# Patient Record
Sex: Female | Born: 1960 | Race: White | Hispanic: No | Marital: Married | Smoking: Never smoker
Health system: Southern US, Community
[De-identification: ages and names within clinical notes are randomized; demographics above are authoritative.]

## PROBLEM LIST (undated history)

## (undated) DIAGNOSIS — E785 Hyperlipidemia, unspecified: Secondary | ICD-10-CM

## (undated) DIAGNOSIS — M40209 Unspecified kyphosis, site unspecified: Secondary | ICD-10-CM

## (undated) DIAGNOSIS — C801 Malignant (primary) neoplasm, unspecified: Secondary | ICD-10-CM

## (undated) DIAGNOSIS — K760 Fatty (change of) liver, not elsewhere classified: Secondary | ICD-10-CM

## (undated) DIAGNOSIS — I10 Essential (primary) hypertension: Secondary | ICD-10-CM

## (undated) DIAGNOSIS — E041 Nontoxic single thyroid nodule: Secondary | ICD-10-CM

## (undated) DIAGNOSIS — M419 Scoliosis, unspecified: Secondary | ICD-10-CM

## (undated) HISTORY — PX: APPENDECTOMY: SHX54

## (undated) HISTORY — DX: Essential (primary) hypertension: I10

## (undated) HISTORY — DX: Malignant (primary) neoplasm, unspecified: C80.1

## (undated) HISTORY — DX: Scoliosis, unspecified: M41.9

## (undated) HISTORY — DX: Unspecified kyphosis, site unspecified: M40.209

## (undated) HISTORY — PX: TONSILLECTOMY: SUR1361

## (undated) HISTORY — DX: Nontoxic single thyroid nodule: E04.1

## (undated) HISTORY — DX: Fatty (change of) liver, not elsewhere classified: K76.0

---
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
## 2013-01-19 HISTORY — PX: COLONOSCOPY: SHX174

## 2014-04-02 HISTORY — PX: BACK SURGERY: SHX140

## 2014-07-22 DIAGNOSIS — M4126 Other idiopathic scoliosis, lumbar region: Secondary | ICD-10-CM | POA: Insufficient documentation

## 2015-12-28 DIAGNOSIS — E041 Nontoxic single thyroid nodule: Secondary | ICD-10-CM | POA: Insufficient documentation

## 2016-11-15 DIAGNOSIS — I10 Essential (primary) hypertension: Secondary | ICD-10-CM | POA: Diagnosis not present

## 2016-11-15 DIAGNOSIS — R079 Chest pain, unspecified: Secondary | ICD-10-CM | POA: Diagnosis not present

## 2018-03-24 ENCOUNTER — Encounter: Payer: Self-pay | Admitting: Gastroenterology

## 2019-01-06 ENCOUNTER — Encounter: Payer: Self-pay | Admitting: Gastroenterology

## 2019-02-01 HISTORY — PX: COLONOSCOPY: SHX174

## 2019-02-04 ENCOUNTER — Encounter: Payer: Self-pay | Admitting: Gastroenterology

## 2019-02-04 ENCOUNTER — Ambulatory Visit: Payer: 59 | Admitting: Gastroenterology

## 2019-02-04 ENCOUNTER — Other Ambulatory Visit: Payer: Self-pay

## 2019-02-04 VITALS — BP 114/74 | HR 86 | Temp 98.7°F | Ht 66.0 in | Wt 173.1 lb

## 2019-02-04 DIAGNOSIS — Z1159 Encounter for screening for other viral diseases: Secondary | ICD-10-CM | POA: Diagnosis not present

## 2019-02-04 DIAGNOSIS — R1310 Dysphagia, unspecified: Secondary | ICD-10-CM

## 2019-02-04 DIAGNOSIS — Z8371 Family history of colonic polyps: Secondary | ICD-10-CM | POA: Diagnosis not present

## 2019-02-04 MED ORDER — CLENPIQ 10-3.5-12 MG-GM -GM/160ML PO SOLN: 1.0000 | Freq: Once | ORAL | 0 refills | Status: AC

## 2019-02-04 NOTE — Progress Notes (Signed)
Chief Complaint: For EGD/colon.  Referring Provider:  Cyndy Freeze, MD      ASSESSMENT AND PLAN;   #1.  Esophageal dysphagia. D/d includes eso stricture, Schatzki's ring, motility disorder, eosinophilic esophagitis, pill induced esophagitis, r/o esophageal Ca or extrinsic lesions.  #2. FH polyps (mom, dad- both around 28s, sis around 75)  Plan: - Proceed with EGD with possible dil/colon (pref Monday). Discussed risks & benefits. (Risks including rare perforation req laparotomy, bleeding after biopsies/polypectomy req blood transfusion, rare chance of missing neoplasms, risks of anesthesia/sedation). Benefits outweigh the risks. Patient agrees to proceed. All the questions were answered. Consent forms given for review. - Chew food well and eat slowly.    HPI:    Connie Cummings is a 58 y.o. 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  Patient's mother had to pat her back.  Ever since she has has been having some problems with intermittent dysphagia mostly to solids in mid chest.  Occasionally pills would get hung up but then will eventually go down.  No heartburn ever.  No odynophagia.  She was due for colonoscopy due to family history of colonic polyps.  She wanted to get EGD with dilatation if needed at the same time.   Currently, no nausea, vomiting, heartburn, regurgitation.  No significant diarrhea or constipation.  There is no melena or hematochezia. No unintentional weight loss.   Past GI procedures: Colonoscopy 12/2012-[facility], neg but fair prep Past Medical History:  Diagnosis Date  . Hyperlipidemia   . Hypertension     Past Surgical History:  Procedure Laterality Date  . APPENDECTOMY    . BACK SURGERY  2016   scoloissi and kyphois so rods and screws in bacl[place]   . CESAREAN SECTION    . COLONOSCOPY  01/19/2013   Small internal hemorrhoids. Otherwise normal colonoscopy to  terminal ileum   . TONSILLECTOMY      Family History  Problem Relation Age of Onset  . Colon polyps Mother   . Colon polyps Sister   . Heart disease Father   . Heart disease Paternal Grandmother   . Diabetes Paternal Uncle   . Diabetes Paternal Aunt   . Heart disease Paternal Aunt     Social History   Tobacco Use  . Smoking status: Never Smoker  . Smokeless tobacco: Never Used  Substance Use Topics  . Alcohol use: Not Currently  . Drug use: Never    Current Outpatient Medications  Medication Sig Dispense Refill  . Multiple Vitamins-Minerals (ZINC PO) Take 1-2 tablets by mouth daily.    Marland Kitchen b complex vitamins capsule Take 1 capsule by mouth daily.    . Multiple Vitamin (MULTI-VITAMIN) tablet Take 1 tablet by mouth daily.    Marland Kitchen telmisartan-hydrochlorothiazide (MICARDIS HCT) 80-25 MG tablet Take 1 tablet by mouth daily.     No current facility-administered medications for this visit.     Allergies  Allergen Reactions  . Oxycodone Shortness Of Breath  . Oxycontin [Oxycodone Hcl]   . Tramadol Rash    Review of Systems:  Constitutional: Denies fever, chills, diaphoresis, appetite change and fatigue.  HEENT: Denies photophobia, eye pain, redness, hearing loss, ear pain, congestion, sore throat, rhinorrhea, sneezing, mouth sores, neck pain, neck stiffness and tinnitus.   Respiratory: Denies SOB, DOE, cough, chest tightness,  and wheezing.   Cardiovascular: Denies chest pain, palpitations and leg swelling.  Genitourinary: Denies dysuria, urgency, frequency, hematuria, flank pain and difficulty urinating.  Musculoskeletal: Denies myalgias, back pain, joint swelling, arthralgias and gait problem.  Skin: No rash.  Neurological: Denies dizziness, seizures, syncope, weakness, light-headedness, numbness and headaches.  Hematological: Denies adenopathy. Easy bruising, personal or family bleeding history  Psychiatric/Behavioral: No anxiety or depression     Physical Exam:    BP  114/74   Pulse 86   Temp 98.7 F (37.1 C)   Ht 5\' 6"  (1.676 m)   Wt 173 lb 2 oz (78.5 kg)   BMI 27.94 kg/m  Filed Weights   02/04/19 0846  Weight: 173 lb 2 oz (78.5 kg)   Constitutional:  Well-developed, in no acute distress. Psychiatric: Normal mood and affect. Behavior is normal. HEENT: Pupils normal.  Conjunctivae are normal. No scleral icterus. Neck supple.  Cardiovascular: Normal rate, regular rhythm. No edema Pulmonary/chest: Effort normal and breath sounds normal. No wheezing, rales or rhonchi. Abdominal: Soft, nondistended. Nontender. Bowel sounds active throughout. There are no masses palpable. No hepatomegaly. Rectal:  defered Neurological: Alert and oriented to person place and time. Skin: Skin is warm and dry. No rashes noted.  Data Reviewed: I have personally reviewed following labs and imaging studies    Carmell Austria, MD 02/04/2019, 9:03 AM  Cc: Cyndy Freeze, MD

## 2019-02-04 NOTE — Patient Instructions (Signed)
You have been scheduled for an endoscopy and colonoscopy. Please follow the written instructions given to you at your visit today. Please pick up your prep supplies at the pharmacy within the next 1-3 days. If you use inhalers (even only as needed), please bring them with you on the day of your procedure. Your physician has requested that you go to www.startemmi.com and enter the access code given to you at your visit today. This web site gives a general overview about your procedure. However, you should still follow specific instructions given to you by our office regarding your preparation for the procedure.  Thank you,  Dr. Jackquline Denmark

## 2019-02-11 ENCOUNTER — Encounter: Payer: Self-pay | Admitting: Gastroenterology

## 2019-02-11 ENCOUNTER — Other Ambulatory Visit: Payer: Self-pay

## 2019-02-11 ENCOUNTER — Ambulatory Visit (AMBULATORY_SURGERY_CENTER): Payer: 59 | Admitting: Gastroenterology

## 2019-02-11 VITALS — BP 142/70 | HR 73 | Temp 98.2°F | Resp 11 | Ht 66.0 in | Wt 173.0 lb

## 2019-02-11 DIAGNOSIS — K2990 Gastroduodenitis, unspecified, without bleeding: Secondary | ICD-10-CM

## 2019-02-11 DIAGNOSIS — Z8371 Family history of colonic polyps: Secondary | ICD-10-CM

## 2019-02-11 DIAGNOSIS — B9681 Helicobacter pylori [H. pylori] as the cause of diseases classified elsewhere: Secondary | ICD-10-CM

## 2019-02-11 DIAGNOSIS — Z1211 Encounter for screening for malignant neoplasm of colon: Secondary | ICD-10-CM

## 2019-02-11 DIAGNOSIS — K2970 Gastritis, unspecified, without bleeding: Secondary | ICD-10-CM | POA: Diagnosis not present

## 2019-02-11 DIAGNOSIS — R1310 Dysphagia, unspecified: Secondary | ICD-10-CM | POA: Diagnosis not present

## 2019-02-11 DIAGNOSIS — K219 Gastro-esophageal reflux disease without esophagitis: Secondary | ICD-10-CM | POA: Diagnosis not present

## 2019-02-11 MED ORDER — OMEPRAZOLE 20 MG PO CPDR: 20.0000 mg | DELAYED_RELEASE_CAPSULE | Freq: Every day | ORAL | 2 refills | Status: DC

## 2019-02-11 MED ORDER — SODIUM CHLORIDE 0.9 % IV SOLN: 500.0000 mL | INTRAVENOUS | Status: DC

## 2019-02-11 NOTE — Op Note (Signed)
[place] Patient Name: Connie Cummings Procedure Date: 02/11/2019 2:58 PM MRN: [id number] Endoscopist: Jackquline Denmark , MD Age: 58 Referring MD:  Date of Birth: 1960/10/12 Gender: Female Account #: [id number] Procedure:                Colonoscopy Indications:              FH polyps (mom, dad- both around 26s, sis around 71) Medicines:                Monitored Anesthesia Care Procedure:                Pre-Anesthesia Assessment:                           - Prior to the procedure, a History and Physical                            was performed, and patient medications and                            allergies were reviewed. The patient's tolerance of                            previous anesthesia was also reviewed. The risks                            and benefits of the procedure and the sedation                            options and risks were discussed with the patient.                            All questions were answered, and informed consent                            was obtained. Prior Anticoagulants: The patient has                            taken no previous anticoagulant or antiplatelet                            agents. ASA Grade Assessment: II - A patient with                            mild systemic disease. After reviewing the risks                            and benefits, the patient was deemed in                            satisfactory condition to undergo the procedure.                           After obtaining informed consent, the colonoscope  was passed under direct vision. Throughout the                            procedure, the patient's blood pressure, pulse, and                            oxygen saturations were monitored continuously. The                            Colonoscope was introduced through the anus and                            advanced to the 2 cm into the ileum. The                            colonoscopy was  performed without difficulty. The                            patient tolerated the procedure well. The quality                            of the bowel preparation was good. The terminal                            ileum, ileocecal valve, appendiceal orifice, and                            rectum were photographed. Scope In: 3:26:31 PM Scope Out: 3:41:05 PM Scope Withdrawal Time: 0 hours 9 minutes 28 seconds  Total Procedure Duration: 0 hours 14 minutes 34 seconds  Findings:                 The colon (entire examined portion) appeared                            normal. The colon was somewhat redundant.                           Non-bleeding internal hemorrhoids were found during                            retroflexion. The hemorrhoids were small.                           The terminal ileum appeared normal.                           The exam was otherwise without abnormality on                            direct and retroflexion views. Complications:            No immediate complications. Estimated Blood Loss:     Estimated blood loss: none. Impression:               -Small internal hemorrhoids.                           -  Otherwise normal colonoscopy to TI. Recommendation:           - Patient has a contact number available for                            emergencies. The signs and symptoms of potential                            delayed complications were discussed with the                            patient. Return to normal activities tomorrow.                            Written discharge instructions were provided to the                            patient.                           - Resume previous diet.                           - Continue present medications.                           - Repeat colonoscopy in 5 years for screening                            purposes d/t strong family history of colonic                            polyps. Earlier, if with any new problems or if                             there is any change in family history.                           - Return to GI clinic PRN.                           - D/W Melene Plan, MD 02/11/2019 3:55:02 PM This report has been signed electronically.

## 2019-02-11 NOTE — Progress Notes (Signed)
A/ox3, pleased with MAC, report to RN 

## 2019-02-11 NOTE — Op Note (Signed)
[place] Patient Name: Connie Cummings Procedure Date: 02/11/2019 3:00 PM MRN: [id number] Endoscopist: Jackquline Denmark , MD Age: 58 Referring MD:  Date of Birth: 06-Jun-1960 Gender: Female Account #: [id number] Procedure:                Upper GI endoscopy Indications:              Dysphagia Medicines:                Monitored Anesthesia Care Procedure:                Pre-Anesthesia Assessment:                           - Prior to the procedure, a History and Physical                            was performed, and patient medications and                            allergies were reviewed. The patient's tolerance of                            previous anesthesia was also reviewed. The risks                            and benefits of the procedure and the sedation                            options and risks were discussed with the patient.                            All questions were answered, and informed consent                            was obtained. Prior Anticoagulants: The patient has                            taken no previous anticoagulant or antiplatelet                            agents. ASA Grade Assessment: II - A patient with                            mild systemic disease. After reviewing the risks                            and benefits, the patient was deemed in                            satisfactory condition to undergo the procedure.                           After obtaining informed consent, the endoscope was  passed under direct vision. Throughout the                            procedure, the patient's blood pressure, pulse, and                            oxygen saturations were monitored continuously. The                            Endoscope was introduced through the mouth, and                            advanced to the second part of duodenum. The upper                            GI endoscopy was accomplished without  difficulty.                            The patient tolerated the procedure well. Scope In: Scope Out: Findings:                 The lower third of the esophagus was mildly                            tortuous. Z-line was normal at 35 cm. Examined by                            NBI as well. Biopsies were obtained from the                            proximal and distal esophagus with cold forceps to                            r/o eosinophilic esophagitis. The scope was                            withdrawn. Dilation was performed with a Maloney                            dilator with mild resistance at 50 Fr.                           Localized minimal inflammation characterized by                            erythema was found in the gastric antrum. Biopsies                            were taken with a cold forceps for histology.                           The examined duodenum was normal. Complications:            No immediate complications. Estimated Blood Loss:  Estimated blood loss: none. Impression:               -Mildly tortuous esophagus s/p empiric esophageal                            dilatation.                           -Minimal gastritis. Recommendation:           - Patient has a contact number available for                            emergencies. The signs and symptoms of potential                            delayed complications were discussed with the                            patient. Return to normal activities tomorrow.                            Written discharge instructions were provided to the                            patient.                           - Post dilatation diet.                           - Omeprazole 20 mg p.o. once a day, #30, 2 refills                            until follow-up visit.                           - Await pathology results.                           - Return to GI clinic in 12 weeks. If with any                            further  dysphagia, would perform further evaluation. Jackquline Denmark, MD 02/11/2019 3:48:46 PM This report has been signed electronically.

## 2019-02-11 NOTE — Patient Instructions (Signed)
HANDOUTS PROVIDED ON: POST DILATION DIET, GASTRITIS, & HEMORRHOIDS   THE BIOPSIES TAKEN TODAY HAVE BEEN SENT FOR PATHOLOGY.  THE RESULTS CAN TAKE 2-3 WEEKS TO RECEIVE.    YOU MAY RESUME YOUR PREVIOUS MEDICATION SCHEDULE.  START TAKING OMEPRAZOLE 20 mg BY MOUTH ONCE DAILY.    RETURN TO GI CLINIC IN 12 WEEKS.  REPEAT COLONOSCOPY IN 5 YEARS UNLESS A NEED ARISES BEFOREHAND.  [place] YOU FOR ALLOWING [place] TO CARE FOR YOU TODAY!!!  YOU HAD AN ENDOSCOPIC PROCEDURE TODAY AT [facility]:   Refer to the procedure report that was given to you for any specific questions about what was found during the examination.  If the procedure report does not answer your questions, please call your gastroenterologist to clarify.  If you requested that your care partner not be given the details of your procedure findings, then the procedure report has been included in a sealed envelope for you to review at your convenience later.  YOU SHOULD EXPECT: Some feelings of bloating in the abdomen. Passage of more gas than usual.  Walking can help get rid of the air that was put into your GI tract during the procedure and reduce the bloating. If you had a lower endoscopy (such as a colonoscopy or flexible sigmoidoscopy) you may notice spotting of blood in your stool or on the toilet paper. If you underwent a bowel prep for your procedure, you may not have a normal bowel movement for a few days.  Please Note:  You might notice some irritation and congestion in your nose or some drainage.  This is from the oxygen used during your procedure.  There is no need for concern and it should clear up in a day or so.  SYMPTOMS TO REPORT IMMEDIATELY:   Following lower endoscopy (colonoscopy or flexible sigmoidoscopy):  Excessive amounts of blood in the stool  Significant tenderness or worsening of abdominal pains  Swelling of the abdomen that is new, acute  Fever of 100F or higher   Following upper endoscopy  (EGD)  Vomiting of blood or coffee ground material  New chest pain or pain under the shoulder blades  Painful or persistently difficult swallowing  New shortness of breath  Fever of 100F or higher  Black, tarry-looking stools  For urgent or emergent issues, a gastroenterologist can be reached at any hour by calling [phone].   DIET:  We do recommend a small meal at first, but then you may proceed to your regular diet.  Drink plenty of fluids but you should avoid alcoholic beverages for 24 hours.  ACTIVITY:  You should plan to take it easy for the rest of today and you should NOT DRIVE or use heavy machinery until tomorrow (because of the sedation medicines used during the test).    FOLLOW UP: Our staff will call the number listed on your records 48-72 hours following your procedure to check on you and address any questions or concerns that you may have regarding the information given to you following your procedure. If we do not reach you, we will leave a message.  We will attempt to reach you two times.  During this call, we will ask if you have developed any symptoms of COVID 19. If you develop any symptoms (ie: fever, flu-like symptoms, shortness of breath, cough etc.) before then, please call [phone].  If you test positive for Covid 19 in the 2 weeks post procedure, please call and report this information to [place].  If any biopsies were taken you will be contacted by phone or by letter within the next 1-3 weeks.  Please call us at [phone] if you have not heard about the biopsies in 3 weeks.    SIGNATURES/CONFIDENTIALITY: You and/or your care partner have signed paperwork which will be entered into your electronic medical record.  These signatures attest to the fact that that the information above on your After Visit Summary has been reviewed and is understood.  Full responsibility of the confidentiality of this discharge information lies with you and/or your  care-partner.

## 2019-02-11 NOTE — Progress Notes (Signed)
Called to room to assist during endoscopic procedure.  Patient ID and intended procedure confirmed with present staff. Received instructions for my participation in the procedure from the performing physician.  

## 2019-02-11 NOTE — Progress Notes (Signed)
Temp JB  vs CW 

## 2019-02-13 ENCOUNTER — Telehealth: Payer: Self-pay

## 2019-02-13 NOTE — Telephone Encounter (Signed)
  Follow up Call-  Call back number 02/11/2019  Post procedure Call Back phone  # [phone]  Permission to leave phone message Yes  Some recent data might be hidden     Patient questions:  Do you have a fever, pain , or abdominal swelling? No. Pain Score  0 *  Have you tolerated food without any problems? Yes.    Have you been able to return to your normal activities? Yes.    Do you have any questions about your discharge instructions: Diet   No. Medications  No. Follow up visit  No.  Do you have questions or concerns about your Care? No.  Actions: * If pain score is 4 or above: No action needed, pain <4.  1. Have you developed a fever since your procedure? no  2.   Have you had an respiratory symptoms (SOB or cough) since your procedure? no  3.   Have you tested positive for COVID 19 since your procedure no  4.   Have you had any family members/close contacts diagnosed with the COVID 19 since your procedure?  no   If yes to any of these questions please route to Joylene John, RN and Alphonsa Gin, Therapist, sports.

## 2019-02-19 ENCOUNTER — Encounter: Payer: Self-pay | Admitting: Gastroenterology

## 2019-02-19 ENCOUNTER — Other Ambulatory Visit: Payer: Self-pay

## 2019-02-19 DIAGNOSIS — A048 Other specified bacterial intestinal infections: Secondary | ICD-10-CM

## 2019-02-19 MED ORDER — METRONIDAZOLE 500 MG PO TABS: 500.0000 mg | ORAL_TABLET | Freq: Two times a day (BID) | ORAL | 0 refills | Status: AC

## 2019-02-19 MED ORDER — AMOXICILLIN 500 MG PO CAPS: 1000.0000 mg | ORAL_CAPSULE | Freq: Two times a day (BID) | ORAL | 0 refills | Status: AC

## 2019-02-19 MED ORDER — OMEPRAZOLE 20 MG PO CPDR: 20.0000 mg | DELAYED_RELEASE_CAPSULE | Freq: Two times a day (BID) | ORAL | 0 refills | Status: DC

## 2019-02-19 MED ORDER — CLARITHROMYCIN 500 MG PO TABS: 500.0000 mg | ORAL_TABLET | Freq: Two times a day (BID) | ORAL | 0 refills | Status: AC

## 2019-02-23 ENCOUNTER — Telehealth: Payer: Self-pay | Admitting: Gastroenterology

## 2019-02-23 NOTE — Telephone Encounter (Signed)
Nausea/diarrhea-since colonoscopy but worse since starting antibiotics-Biaxin is causing nausea=other antibiotics causing horrible diarrhea Eating/not eating=still having diarrhea-requesting alternative antibiotics or medications for H.Pylori-metallic taste mouth-had to call out of work today due to the diarrhea and nausea-eating when taking the medication and not drinking alcohol with medication- Please advise

## 2019-02-24 NOTE — Telephone Encounter (Signed)
Hold off on medications for now Let us see how she does in the next few days. Please make appointment for virtual visit in the next 2 weeks. May have to give her something else for H. Pylori. RG

## 2019-02-25 NOTE — Telephone Encounter (Signed)
Called and spoke with patient-patient reports she has still been taking the antibiotics and she is feeling "much better than I was"; patient report she will continue to take the antibiotics and follow up with Dr. Lyndel Safe on a VIRTUAL visit on 03/12/2019 at 11:40; Patient advised to call back to the office at [phone] should questions/concerns arise; Patient verbalized understanding of information/instructions;

## 2019-03-03 ENCOUNTER — Other Ambulatory Visit: Payer: Self-pay | Admitting: Endocrinology

## 2019-03-03 DIAGNOSIS — E041 Nontoxic single thyroid nodule: Secondary | ICD-10-CM

## 2019-03-12 ENCOUNTER — Telehealth: Payer: 59 | Admitting: Gastroenterology

## 2019-04-02 ENCOUNTER — Other Ambulatory Visit: Payer: 59

## 2019-04-02 DIAGNOSIS — A048 Other specified bacterial intestinal infections: Secondary | ICD-10-CM

## 2019-04-07 ENCOUNTER — Other Ambulatory Visit: Payer: 59

## 2019-04-08 ENCOUNTER — Other Ambulatory Visit: Payer: 59

## 2019-04-08 LAB — H. PYLORI ANTIGEN, STOOL: H pylori Ag, Stl: NEGATIVE

## 2019-04-29 ENCOUNTER — Ambulatory Visit
Admission: RE | Admit: 2019-04-29 | Discharge: 2019-04-29 | Disposition: A | Discharge status: Home / Self Care | Payer: 59 | Source: Ambulatory Visit | Attending: Endocrinology | Admitting: Endocrinology

## 2019-04-29 DIAGNOSIS — E041 Nontoxic single thyroid nodule: Secondary | ICD-10-CM

## 2019-04-29 DIAGNOSIS — E042 Nontoxic multinodular goiter: Secondary | ICD-10-CM | POA: Diagnosis not present

## 2019-05-01 DIAGNOSIS — L259 Unspecified contact dermatitis, unspecified cause: Secondary | ICD-10-CM | POA: Diagnosis not present

## 2019-05-01 DIAGNOSIS — Z6829 Body mass index (BMI) 29.0-29.9, adult: Secondary | ICD-10-CM | POA: Diagnosis not present

## 2019-05-06 ENCOUNTER — Other Ambulatory Visit: Payer: Self-pay

## 2019-05-06 ENCOUNTER — Telehealth (INDEPENDENT_AMBULATORY_CARE_PROVIDER_SITE_OTHER): Payer: 59 | Admitting: Gastroenterology

## 2019-05-06 VITALS — Ht 66.0 in | Wt 170.0 lb

## 2019-05-06 DIAGNOSIS — E041 Nontoxic single thyroid nodule: Secondary | ICD-10-CM | POA: Diagnosis not present

## 2019-05-06 DIAGNOSIS — I10 Essential (primary) hypertension: Secondary | ICD-10-CM | POA: Diagnosis not present

## 2019-05-06 DIAGNOSIS — R1310 Dysphagia, unspecified: Secondary | ICD-10-CM

## 2019-05-06 DIAGNOSIS — A048 Other specified bacterial intestinal infections: Secondary | ICD-10-CM

## 2019-05-06 DIAGNOSIS — E0590 Thyrotoxicosis, unspecified without thyrotoxic crisis or storm: Secondary | ICD-10-CM | POA: Diagnosis not present

## 2019-05-06 NOTE — Progress Notes (Signed)
Chief Complaint: For FU  Referring Provider:  Mateo Flow, MD      ASSESSMENT AND PLAN;   #1. Eso dysphagia s/p EGD with dil 02/2019 -complete resolution of dysphagia.   #2. FH polyps (mom, dad- both around 9s, sis around 24). Neg colon 02/2019.  Recommended to rpt in 5 yrs  #3. 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  Treated with triple drug therapy. Confirmed neg.  Plan: -Continue omeprazole 20 mg p.o. once a day.  Can try it every other day. -Call if with any problems. -Follow-up as needed.    HPI:    Connie Cummings is a 59 y.o. 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  No further dysphagia.  No heartburn.  Feels much better.  Denies having any diarrhea or constipation.  Had stool antigen which was confirmed negative for H. pylori per patient.  Pleased with the progress  Colonoscopy 02/2019: Small internal hemorrhoids, otherwise Nl to TI EGD 02/2019-mildly tortuous esophagus , s/p dil 50 Fr. Neg Bx for EoE. HP gastritis.  Treated with clarithromycin, amox/Flagyl/omeprazole x 14 days.  Stool test was confirmed neg thereafter. Past Medical History:  Diagnosis Date  . Cancer ([place])    melanoma  . Hypertension   . Kyphosis   . Scoliosis     Past Surgical History:  Procedure Laterality Date  . APPENDECTOMY    . BACK SURGERY  2016   scoliosis and kyphosis so rods and screws in back  . CESAREAN SECTION    . COLONOSCOPY  01/19/2013   Small internal hemorrhoids. Otherwise normal colonoscopy to terminal ileum   . TONSILLECTOMY      Family History  Problem Relation Age of Onset  . Colon polyps Mother   . Colon polyps Sister   . Heart disease Father   . Heart disease Paternal Grandmother   . Diabetes Paternal Uncle   . Diabetes Paternal Aunt   . Heart disease Paternal Aunt     Social History   Tobacco Use  . Smoking status: Never Smoker  . Smokeless tobacco: Never Used  Substance Use Topics  . Alcohol use: Not Currently  . Drug  use: Never    Current Outpatient Medications  Medication Sig Dispense Refill  . 5-Hydroxytryptophan (5-HTP PO) Take 2 tablets by mouth daily.     . AMBULATORY NON FORMULARY MEDICATION 2 tablets daily. Estropause     . AMBULATORY NON FORMULARY MEDICATION 1 tablet daily. Liver Refresh    . b complex vitamins capsule Take 1 capsule by mouth daily.    Marland Kitchen CALCIUM PO Take 1 tablet by mouth daily.    Marland Kitchen estradiol (VIVELLE-DOT) 0.05 MG/24HR patch Place 1 patch onto the skin 2 (two) times a week.     Marland Kitchen GLUCOSAMINE-CHONDROIT-MSM-C-MN PO Take 1 tablet by mouth daily.    Marland Kitchen L-LYSINE PO Take 1 tablet by mouth daily.    Marland Kitchen MAGNESIUM PO Take 1 tablet by mouth daily.    . Misc Natural Products (ADRENAL PO) Take 1 tablet by mouth daily.    . Multiple Vitamin (MULTI-VITAMIN) tablet Take 1 tablet by mouth daily.    . Multiple Vitamins-Minerals (ZINC PO) Take 1-2 tablets by mouth daily.    . Nutritional Supplements (DHEA PO) Take 1 tablet by mouth daily.    Marland Kitchen omeprazole (PRILOSEC) 20 MG capsule Take 1 capsule (20 mg total) by mouth daily. 30 capsule 2  . Probiotic Product (PROBIOTIC PO) Take 1 tablet by mouth daily.  Brand Name Mega Sporebiotic    . PROGESTERONE MICRONIZED PO Take 300 mg by mouth daily.    Marland Kitchen telmisartan-hydrochlorothiazide (MICARDIS HCT) 80-25 MG tablet Take 1 tablet by mouth daily.    . Vitamin D, Ergocalciferol, (DRISDOL) 1.25 MG (50000 UT) CAPS capsule Take 50,000 Units by mouth every 7 (seven) days.     . Vitamin D-Vitamin K (VITAMIN K2-VITAMIN D3 PO) Take 1 tablet by mouth daily.     No current facility-administered medications for this visit.    Allergies  Allergen Reactions  . Oxycodone Shortness Of Breath  . Oxycontin [Oxycodone Hcl]   . Tramadol Rash    Review of Systems:  neg     Physical Exam:    Ht 5\' 6"  (1.676 m)   Wt 170 lb (77.1 kg)   BMI 27.44 kg/m  Filed Weights   05/06/19 0803  Weight: 170 lb (77.1 kg)  Not examined since it was a televisit  I connected  with  Connie Cummings on 05/06/19 by a telephone telemedicine application and verified that I am speaking with the correct person using two identifiers.   I discussed the limitations of evaluation and management by telemedicine. The patient expressed understanding and agreed to proceed.      Carmell Austria, MD 05/06/2019, 8:50 AM  Cc: Mateo Flow, MD

## 2019-05-06 NOTE — Patient Instructions (Signed)
If you are age 58 or older, your body mass index should be between 23-30. Your Body mass index is 27.44 kg/m. If this is out of the aforementioned range listed, please consider follow up with your Primary Care Provider.  If you are age 53 or younger, your body mass index should be between 19-25. Your Body mass index is 27.44 kg/m. If this is out of the aformentioned range listed, please consider follow up with your Primary Care Provider.   Continue Omeprazole  Follow up as needed.   Thank you,  Dr. Jackquline Denmark

## 2019-06-03 DIAGNOSIS — Z85820 Personal history of malignant melanoma of skin: Secondary | ICD-10-CM | POA: Diagnosis not present

## 2019-06-03 DIAGNOSIS — L814 Other melanin hyperpigmentation: Secondary | ICD-10-CM | POA: Diagnosis not present

## 2019-06-03 DIAGNOSIS — Z85828 Personal history of other malignant neoplasm of skin: Secondary | ICD-10-CM | POA: Diagnosis not present

## 2019-06-03 DIAGNOSIS — D225 Melanocytic nevi of trunk: Secondary | ICD-10-CM | POA: Diagnosis not present

## 2019-06-03 DIAGNOSIS — L821 Other seborrheic keratosis: Secondary | ICD-10-CM | POA: Diagnosis not present

## 2019-06-03 DIAGNOSIS — D1801 Hemangioma of skin and subcutaneous tissue: Secondary | ICD-10-CM | POA: Diagnosis not present

## 2019-06-03 DIAGNOSIS — L570 Actinic keratosis: Secondary | ICD-10-CM | POA: Diagnosis not present

## 2019-07-14 DIAGNOSIS — R5383 Other fatigue: Secondary | ICD-10-CM | POA: Diagnosis not present

## 2019-07-14 DIAGNOSIS — E639 Nutritional deficiency, unspecified: Secondary | ICD-10-CM | POA: Diagnosis not present

## 2019-07-14 DIAGNOSIS — E559 Vitamin D deficiency, unspecified: Secondary | ICD-10-CM | POA: Diagnosis not present

## 2019-07-14 DIAGNOSIS — E041 Nontoxic single thyroid nodule: Secondary | ICD-10-CM | POA: Diagnosis not present

## 2019-07-14 DIAGNOSIS — N951 Menopausal and female climacteric states: Secondary | ICD-10-CM | POA: Diagnosis not present

## 2019-07-31 DIAGNOSIS — E559 Vitamin D deficiency, unspecified: Secondary | ICD-10-CM | POA: Diagnosis not present

## 2019-07-31 DIAGNOSIS — L239 Allergic contact dermatitis, unspecified cause: Secondary | ICD-10-CM | POA: Diagnosis not present

## 2019-07-31 DIAGNOSIS — E639 Nutritional deficiency, unspecified: Secondary | ICD-10-CM | POA: Diagnosis not present

## 2019-07-31 DIAGNOSIS — E041 Nontoxic single thyroid nodule: Secondary | ICD-10-CM | POA: Diagnosis not present

## 2019-07-31 DIAGNOSIS — R238 Other skin changes: Secondary | ICD-10-CM | POA: Diagnosis not present

## 2019-07-31 DIAGNOSIS — N951 Menopausal and female climacteric states: Secondary | ICD-10-CM | POA: Diagnosis not present

## 2019-07-31 DIAGNOSIS — R5383 Other fatigue: Secondary | ICD-10-CM | POA: Diagnosis not present

## 2019-07-31 DIAGNOSIS — R208 Other disturbances of skin sensation: Secondary | ICD-10-CM | POA: Diagnosis not present

## 2019-07-31 DIAGNOSIS — R69 Illness, unspecified: Secondary | ICD-10-CM | POA: Diagnosis not present

## 2019-11-06 DIAGNOSIS — Z20822 Contact with and (suspected) exposure to covid-19: Secondary | ICD-10-CM | POA: Diagnosis not present

## 2019-12-24 DIAGNOSIS — Z20822 Contact with and (suspected) exposure to covid-19: Secondary | ICD-10-CM | POA: Diagnosis not present

## 2020-01-15 DIAGNOSIS — L239 Allergic contact dermatitis, unspecified cause: Secondary | ICD-10-CM | POA: Diagnosis not present

## 2020-01-15 DIAGNOSIS — E639 Nutritional deficiency, unspecified: Secondary | ICD-10-CM | POA: Diagnosis not present

## 2020-01-15 DIAGNOSIS — R5383 Other fatigue: Secondary | ICD-10-CM | POA: Diagnosis not present

## 2020-01-15 DIAGNOSIS — E041 Nontoxic single thyroid nodule: Secondary | ICD-10-CM | POA: Diagnosis not present

## 2020-01-15 DIAGNOSIS — E538 Deficiency of other specified B group vitamins: Secondary | ICD-10-CM | POA: Diagnosis not present

## 2020-01-15 DIAGNOSIS — E559 Vitamin D deficiency, unspecified: Secondary | ICD-10-CM | POA: Diagnosis not present

## 2020-01-15 DIAGNOSIS — R208 Other disturbances of skin sensation: Secondary | ICD-10-CM | POA: Diagnosis not present

## 2020-01-15 DIAGNOSIS — E039 Hypothyroidism, unspecified: Secondary | ICD-10-CM | POA: Diagnosis not present

## 2020-01-15 DIAGNOSIS — R69 Illness, unspecified: Secondary | ICD-10-CM | POA: Diagnosis not present

## 2020-01-15 DIAGNOSIS — N951 Menopausal and female climacteric states: Secondary | ICD-10-CM | POA: Diagnosis not present

## 2020-01-15 DIAGNOSIS — R238 Other skin changes: Secondary | ICD-10-CM | POA: Diagnosis not present

## 2020-01-25 DIAGNOSIS — H00022 Hordeolum internum right lower eyelid: Secondary | ICD-10-CM | POA: Diagnosis not present

## 2020-01-25 DIAGNOSIS — Z6831 Body mass index (BMI) 31.0-31.9, adult: Secondary | ICD-10-CM | POA: Diagnosis not present

## 2020-01-25 DIAGNOSIS — I10 Essential (primary) hypertension: Secondary | ICD-10-CM | POA: Diagnosis not present

## 2020-01-29 DIAGNOSIS — R238 Other skin changes: Secondary | ICD-10-CM | POA: Diagnosis not present

## 2020-01-29 DIAGNOSIS — R5383 Other fatigue: Secondary | ICD-10-CM | POA: Diagnosis not present

## 2020-01-29 DIAGNOSIS — L239 Allergic contact dermatitis, unspecified cause: Secondary | ICD-10-CM | POA: Diagnosis not present

## 2020-01-29 DIAGNOSIS — E538 Deficiency of other specified B group vitamins: Secondary | ICD-10-CM | POA: Diagnosis not present

## 2020-01-29 DIAGNOSIS — N951 Menopausal and female climacteric states: Secondary | ICD-10-CM | POA: Diagnosis not present

## 2020-01-29 DIAGNOSIS — E559 Vitamin D deficiency, unspecified: Secondary | ICD-10-CM | POA: Diagnosis not present

## 2020-01-29 DIAGNOSIS — R69 Illness, unspecified: Secondary | ICD-10-CM | POA: Diagnosis not present

## 2020-01-29 DIAGNOSIS — R208 Other disturbances of skin sensation: Secondary | ICD-10-CM | POA: Diagnosis not present

## 2020-01-29 DIAGNOSIS — E041 Nontoxic single thyroid nodule: Secondary | ICD-10-CM | POA: Diagnosis not present

## 2020-01-29 DIAGNOSIS — M25541 Pain in joints of right hand: Secondary | ICD-10-CM | POA: Diagnosis not present

## 2020-02-12 DIAGNOSIS — Z0000 Encounter for general adult medical examination without abnormal findings: Secondary | ICD-10-CM | POA: Diagnosis not present

## 2020-02-12 DIAGNOSIS — N3946 Mixed incontinence: Secondary | ICD-10-CM | POA: Diagnosis not present

## 2020-02-12 DIAGNOSIS — Z780 Asymptomatic menopausal state: Secondary | ICD-10-CM | POA: Diagnosis not present

## 2020-02-12 DIAGNOSIS — Z1331 Encounter for screening for depression: Secondary | ICD-10-CM | POA: Diagnosis not present

## 2020-02-12 DIAGNOSIS — Z124 Encounter for screening for malignant neoplasm of cervix: Secondary | ICD-10-CM | POA: Diagnosis not present

## 2020-02-12 DIAGNOSIS — M810 Age-related osteoporosis without current pathological fracture: Secondary | ICD-10-CM | POA: Diagnosis not present

## 2020-03-09 DIAGNOSIS — Z1231 Encounter for screening mammogram for malignant neoplasm of breast: Secondary | ICD-10-CM | POA: Diagnosis not present

## 2020-03-17 NOTE — Progress Notes (Signed)
Office Visit Note  Patient: Connie Cummings             Date of Birth: March 17, 1961           MRN: [id number]             PCP: Mateo Flow, MD Referring: Mateo Flow, MD Visit Date: 03/18/2020 Occupation: Home health nurse  Subjective:   History of Present Illness: Connie Cummings is a 59 y.o. female here for evaluation of positive ANA and joint pain of multiple sites most problems in bilateral hands.  She states the problem is ongoing for several years with typically episodic pain that is affecting many joints including her hands shoulders hips and knees.  She is really extensive bony changes of bilateral hands which are often painful.  In addition to joint pain she has noticed fatigue, petechial rash the lower extremities that she most often notices in the fall and spring.  She states prior biopsy was thought to represent vasculitis by one dermatologist but had difference of opinion with more recent dermatologist. 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 She denies any known specific family history of autoimmune disease, she recalls her father had some type of hand problems early onset but passed away at 58 y/o.  Labs reviewed 01/2020 ANA positive dsDNA 106 RF negative CCP negative ESR 16  Activities of Daily Living:  Patient reports morning stiffness for 0-24 hours.   Patient Reports nocturnal pain.  Difficulty dressing/grooming: Reports Difficulty climbing stairs: Denies Difficulty getting out of chair: Denies Difficulty using hands for taps, buttons, cutlery, and/or writing: Reports  Review of Systems  Constitutional: Positive for fatigue.  HENT: Negative for mouth sores, mouth dryness and nose dryness.   Eyes: Positive for pain, itching and visual disturbance. Negative for dryness.  Respiratory: Negative for cough, hemoptysis, shortness of breath and  difficulty breathing.   Cardiovascular: Positive for swelling in legs/feet. Negative for chest pain and palpitations.  Gastrointestinal: Negative for abdominal pain, blood in stool, constipation and diarrhea.  Endocrine: Negative for increased urination.  Genitourinary: Negative for painful urination.  Musculoskeletal: Positive for arthralgias, joint pain, joint swelling, morning stiffness and muscle tenderness. Negative for myalgias, muscle weakness and myalgias.  Skin: Negative for color change, rash and redness.  Allergic/Immunologic: Negative for susceptible to infections.  Neurological: Positive for dizziness and weakness. Negative for numbness, headaches and memory loss.  Hematological: Negative for swollen glands.  Psychiatric/Behavioral: Negative for confusion and sleep disturbance.    PMFS History:  Patient Active Problem List   Diagnosis Date Noted  . Bilateral hand pain 03/18/2020  . Positive ANA (antinuclear antibody) 03/18/2020  . Petechial rash 03/18/2020  . Essential hypertension 03/18/2020  . Subclinical hyperthyroidism 03/18/2020  . Thyroid nodule 12/28/2015  . Other idiopathic scoliosis, lumbar region 07/22/2014    Past Medical History:  Diagnosis Date  . Cancer ([place])    melanoma  . Hypertension   . Kyphosis   . Scoliosis   . Thyroid nodule    Per patient    Family History  Problem Relation Age of Onset  . Thyroid disease Mother   . Colon polyps Sister   . Thyroid disease Sister   . Heart disease Father   . Heart disease Paternal Grandmother   . Diabetes Paternal Uncle   . Diabetes Paternal Aunt   . Heart disease Paternal Aunt   .  Stroke Daughter   . Celiac disease Daughter   . Healthy Daughter   . Thyroid disease Daughter   . Healthy Son    Past Surgical History:  Procedure Laterality Date  . APPENDECTOMY    . BACK SURGERY  2016   scoliosis and kyphosis so rods and screws in back  . CESAREAN SECTION    . COLONOSCOPY  01/19/2013   Small  internal hemorrhoids. Otherwise normal colonoscopy to terminal ileum   . TONSILLECTOMY     Social History   Social History Narrative  . Not on file   Immunization History  Administered Date(s) Administered  . PFIZER SARS-COV-2 Vaccination 06/17/2019, 07/08/2019     Objective: Vital Signs: BP 139/84 (BP Location: Right Arm, Patient Position: Sitting, Cuff Size: Normal)   Pulse 71   Ht 5' 4.75" (1.645 m)   Wt 188 lb (85.3 kg)   BMI 31.53 kg/m    Physical Exam HENT:     Right Ear: External ear normal.     Left Ear: External ear normal.     Mouth/Throat:     Mouth: Mucous membranes are moist.     Pharynx: Oropharynx is clear.  Eyes:     Comments: Mild conjunctival injection bilaterally  Cardiovascular:     Rate and Rhythm: Normal rate and regular rhythm.  Pulmonary:     Effort: Pulmonary effort is normal.     Breath sounds: Normal breath sounds.  Skin:    General: Skin is warm and dry.     Comments: Scattered petechiae over ankles and dorsal foot most on the medial half no significant associated edema or erythema there are some superficial venous varicosities  Neurological:     Mental Status: She is alert.      Musculoskeletal Exam:  Neck full range of motion no tenderness Shoulders full range of motion bilaterally Elbows no swelling tenderness left extension range of motion slightly reduced compared to right Wrist normal range of motion no swelling or tenderness Numerous bony nodules and joint deviations and rotations present at the PIP and DIP joints of both hands right hand worse than left and second and third digits worse no synovitis No paraspinal tenderness to palpation over upper and lower back well-healed surgical scar over most of the back Normal hip internal and external rotation mild pain to right groin, right lateral hip tenderness to pressure Knees full range of motion no swelling, ankles, MTPs full range of motion no tenderness or  swelling    Investigation: No additional findings.  Imaging: XR Hand 2 View Left  Result Date: 03/18/2020 X-ray left hand 2 views Radiocarpal carpal joint space appear normal.  MCP joints appear well-preserved.  OA of the PIP joints especially the ulnar half of second and third digits.  DIP joint space also seen worst at third digit but less than PIPs.  No significant particular osteopenia seen.  No soft tissue swelling seen. Impression Osteoarthritis changes most severe in the second and third PIP joints no inflammatory abnormality seen  XR Hand 2 View Right  Result Date: 03/18/2020 X-ray right hand 2 views Radiocarpal carpal joint spaces appear preserved.  Some cystic changes in carpal bones.  MCP joints are well preserved.  Severe osteoarthritis changes seen in second and fifth PIP joints with osteophytes and irregular joint space loss.  DIP joint OA most severe at third digit joint appears fused.  No bone erosions seen no significant particular osteopenia.  No soft tissue swelling seen. Impression Advanced OA in  the PIP and DIP joints with large osteophytes and extensive remodeling no inflammatory abnormalities noted   Recent Labs: No results found for: WBC, HGB, PLT, NA, K, CL, CO2, GLUCOSE, BUN, CREATININE, BILITOT, ALKPHOS, AST, ALT, PROT, ALBUMIN, CALCIUM, GFRAA, QFTBGOLD, QFTBGOLDPLUS  Speciality Comments: No specialty comments available.  Procedures:  No procedures performed Allergies: Oxycodone, Oxycontin [oxycodone hcl], and Tramadol   Assessment / Plan:     Visit Diagnoses: Positive ANA (antinuclear antibody) Petechial rash  Her previous urology testing is a significantly positive double-stranded DNA antibody but other findings would not classify systemic lupus and today symptoms are nearly absent.  She has had petechial rash to the lower extremities sounds like questionable biopsy histology seasonal correlation of symptoms unusual for this.  Recommend send for AVISE  testing for complete autoimmune antibody work-up including confirmatory testing of dsDNA by crithidia.  No specific treatment recommendation in the absence of many symptoms but also recommend she could return to repeat evaluation when she does next experience a flareup of her problems.  Bilateral hand pain - Plan: XR Hand 2 View Right, XR Hand 2 View Left  Bilateral hand pain with extensive bony changes seen on exam x-rays were obtained consistent with advanced osteoarthritis.  No central erosions but has third DIP fusion and extensive abnormality involved joints.  No osteopenia or erosions to indicate active inflammatory disease.  Orders: Orders Placed This Encounter  Procedures  . XR Hand 2 View Right  . XR Hand 2 View Left   No orders of the defined types were placed in this encounter.    Follow-Up Instructions: Return if symptoms worsen or fail to improve.   Collier Salina, MD  Note - This record has been created using [employer].  Chart creation errors have been sought, but may not always  have been located. Such creation errors do not reflect on  the standard of medical care.

## 2020-03-18 ENCOUNTER — Ambulatory Visit (INDEPENDENT_AMBULATORY_CARE_PROVIDER_SITE_OTHER): Payer: 59

## 2020-03-18 ENCOUNTER — Ambulatory Visit: Payer: Self-pay

## 2020-03-18 ENCOUNTER — Other Ambulatory Visit: Payer: Self-pay

## 2020-03-18 ENCOUNTER — Ambulatory Visit: Payer: 59 | Admitting: Internal Medicine

## 2020-03-18 ENCOUNTER — Encounter: Payer: Self-pay | Admitting: Internal Medicine

## 2020-03-18 VITALS — BP 139/84 | HR 71 | Ht 64.75 in | Wt 188.0 lb

## 2020-03-18 DIAGNOSIS — M79642 Pain in left hand: Secondary | ICD-10-CM | POA: Diagnosis not present

## 2020-03-18 DIAGNOSIS — R768 Other specified abnormal immunological findings in serum: Secondary | ICD-10-CM

## 2020-03-18 DIAGNOSIS — L814 Other melanin hyperpigmentation: Secondary | ICD-10-CM | POA: Diagnosis not present

## 2020-03-18 DIAGNOSIS — L821 Other seborrheic keratosis: Secondary | ICD-10-CM | POA: Diagnosis not present

## 2020-03-18 DIAGNOSIS — M79641 Pain in right hand: Secondary | ICD-10-CM | POA: Diagnosis not present

## 2020-03-18 DIAGNOSIS — D1801 Hemangioma of skin and subcutaneous tissue: Secondary | ICD-10-CM | POA: Diagnosis not present

## 2020-03-18 DIAGNOSIS — R233 Spontaneous ecchymoses: Secondary | ICD-10-CM | POA: Diagnosis not present

## 2020-03-18 DIAGNOSIS — Z85828 Personal history of other malignant neoplasm of skin: Secondary | ICD-10-CM | POA: Diagnosis not present

## 2020-03-18 DIAGNOSIS — L980 Pyogenic granuloma: Secondary | ICD-10-CM | POA: Diagnosis not present

## 2020-03-18 DIAGNOSIS — I10 Essential (primary) hypertension: Secondary | ICD-10-CM | POA: Insufficient documentation

## 2020-03-18 DIAGNOSIS — D2361 Other benign neoplasm of skin of right upper limb, including shoulder: Secondary | ICD-10-CM | POA: Diagnosis not present

## 2020-03-18 NOTE — Patient Instructions (Signed)
We will contact to follow up on image and test results. dsDNA antibody test is most associated with lupus but would not diagnose lupus based on current findings. I suspect it will be more likely to identify any specific cause to see you again when symptoms become more active and you can call to schedule a follow up that same week when they do.

## 2020-03-22 DIAGNOSIS — Z6831 Body mass index (BMI) 31.0-31.9, adult: Secondary | ICD-10-CM | POA: Diagnosis not present

## 2020-03-22 DIAGNOSIS — R928 Other abnormal and inconclusive findings on diagnostic imaging of breast: Secondary | ICD-10-CM | POA: Diagnosis not present

## 2020-03-23 ENCOUNTER — Encounter: Payer: Self-pay | Admitting: Internal Medicine

## 2020-03-23 DIAGNOSIS — M79641 Pain in right hand: Secondary | ICD-10-CM | POA: Diagnosis not present

## 2020-03-23 DIAGNOSIS — R768 Other specified abnormal immunological findings in serum: Secondary | ICD-10-CM | POA: Diagnosis not present

## 2020-03-23 DIAGNOSIS — R233 Spontaneous ecchymoses: Secondary | ICD-10-CM | POA: Diagnosis not present

## 2020-04-05 ENCOUNTER — Telehealth: Payer: Self-pay

## 2020-04-05 NOTE — Telephone Encounter (Signed)
Attempted to contact patient regarding her flare and scheduling an appointment, left voicemail advising her we have an appointment at 11 am tomorrow 04/06/2020 available if she can call back ASAP we will get her scheduled.

## 2020-04-05 NOTE — Telephone Encounter (Signed)
Patient left a voicemail stating Dr. Dimple Casey wanted her to call the office when "she was in the middle of a flair."  Patient requested a return call.

## 2020-04-05 NOTE — Telephone Encounter (Signed)
Patient called stating she was returning your call regarding an appointment with Dr. Dimple Casey.  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  Patient states "she was told she would be worked into the schedule when she was in the middle of a Licensed conveyancer."

## 2020-04-05 NOTE — Telephone Encounter (Signed)
Tentatively scheduled patient for 3:20 pm tomorrow, 04/06/2020, afternoon. Left voicemail advising patient to call the office to either confirm or let us know whether she cannot make that time.

## 2020-04-06 ENCOUNTER — Encounter: Payer: Self-pay | Admitting: Internal Medicine

## 2020-04-06 ENCOUNTER — Other Ambulatory Visit: Payer: Self-pay

## 2020-04-06 ENCOUNTER — Ambulatory Visit: Payer: 59 | Admitting: Internal Medicine

## 2020-04-06 VITALS — BP 109/71 | HR 76 | Ht 65.0 in | Wt 194.3 lb

## 2020-04-06 DIAGNOSIS — E0590 Thyrotoxicosis, unspecified without thyrotoxic crisis or storm: Secondary | ICD-10-CM | POA: Diagnosis not present

## 2020-04-06 DIAGNOSIS — R768 Other specified abnormal immunological findings in serum: Secondary | ICD-10-CM | POA: Diagnosis not present

## 2020-04-06 DIAGNOSIS — B009 Herpesviral infection, unspecified: Secondary | ICD-10-CM | POA: Diagnosis not present

## 2020-04-06 DIAGNOSIS — E041 Nontoxic single thyroid nodule: Secondary | ICD-10-CM | POA: Diagnosis not present

## 2020-04-06 NOTE — Progress Notes (Signed)
Office Visit Note  Patient: Connie Cummings             Date of Birth: 20-Oct-1960           MRN: [id number]             PCP: Mateo Flow, MD Referring: Mateo Flow, MD Visit Date: 04/06/2020  Subjective:  Follow-up (Patient complains of myalgias, joint pain, headache, and eye issues - symptoms began 3-4 days ago and have improved some but have not resolved. )   History of Present Illness: Connie Cummings is a 60 y.o. female here for follow up of arthralgias and positive ANA. She reports increased symptoms and also has lab results for review. Since the last visit she continues to have fatigue and had an episode of increased eye dryness, headaches, and joint pains since about 3-4 days ago but partially improved on its own and then she had outbreak of HSV rash on the left side of face for which she took acyclovir. Review of labs from last visit were negative for dsDNA on confirmatory testing. Thyroglobulin Abs were equivocal. Xrays demonstrated degenerative changes without demineralization or erosions.  AVISE CTD Test Report ANA 1:320 dsDNA by crithidia negative Thyroglobulin Ab 44  Review of Systems  Constitutional: Positive for fatigue.  HENT: Negative for mouth sores, mouth dryness and nose dryness.   Eyes: Positive for pain, itching, visual disturbance and dryness.  Respiratory: Negative for cough, hemoptysis, shortness of breath and difficulty breathing.   Cardiovascular: Negative for chest pain, palpitations and swelling in legs/feet.  Gastrointestinal: Positive for abdominal pain. Negative for blood in stool, constipation and diarrhea.  Endocrine: Negative for increased urination.  Genitourinary: Negative for painful urination.  Musculoskeletal: Positive for arthralgias, joint pain, joint swelling and morning stiffness. Negative for myalgias, muscle weakness, muscle tenderness and myalgias.  Skin: Negative for color change, rash and redness.  Allergic/Immunologic: Negative for  susceptible to infections.  Neurological: Positive for headaches and weakness. Negative for dizziness, numbness and memory loss.  Hematological: Negative for swollen glands.  Psychiatric/Behavioral: Negative for confusion and sleep disturbance.    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    Past Medical History:  Diagnosis Date   Cancer ([place])    melanoma   Hypertension    Kyphosis    Scoliosis    Thyroid nodule    Per patient    Family History  Problem Relation Age of Onset   Thyroid disease Mother    Colon polyps Sister    Thyroid disease Sister    Heart disease Father    Heart disease Paternal Grandmother    Diabetes Paternal Uncle    Diabetes Paternal Aunt    Heart disease Paternal Aunt    Stroke Daughter    Celiac disease Daughter    Healthy Daughter    Thyroid disease Daughter    Healthy Son    Past Surgical History:  Procedure Laterality Date   APPENDECTOMY     BACK SURGERY  2016   scoliosis and kyphosis so rods and screws in back   CESAREAN SECTION     COLONOSCOPY  01/19/2013   Small internal hemorrhoids. Otherwise normal colonoscopy to terminal ileum  TONSILLECTOMY     Social History   Social History Narrative   Not on file   Immunization History  Administered Date(s) Administered   PFIZER SARS-COV-2 Vaccination 06/17/2019, 07/08/2019     Objective: Vital Signs: BP 109/71 (BP Location: Right Arm, Patient Position: Sitting, Cuff Size: Normal)    Pulse 76    Ht 5\' 5"  (1.651 m)    Wt 194 lb 4.8 oz (88.1 kg)    BMI 32.33 kg/m    Physical Exam HENT:     Right Ear: External ear normal.     Left Ear: External ear normal.  Eyes:      Comments: Mild conjunctival injection bilaterally  Skin:    General: Skin is warm and dry.  Neurological:     Mental Status: She is alert.      Musculoskeletal Exam:  Neck full range of motion, no palpable nodules Elbows no swelling tenderness left extension range of motion slightly reduced compared to right Wrist normal range of motion no swelling or tenderness Numerous bony nodules and joint deviations and rotations present at the PIP and DIP joints of both hands   Investigation: No additional findings.  Imaging: XR Hand 2 View Left  Result Date: 03/18/2020 X-ray left hand 2 views Radiocarpal carpal joint space appear normal.  MCP joints appear well-preserved.  OA of the PIP joints especially the ulnar half of second and third digits.  DIP joint space also seen worst at third digit but less than PIPs.  No significant particular osteopenia seen.  No soft tissue swelling seen. Impression Osteoarthritis changes most severe in the second and third PIP joints no inflammatory abnormality seen  XR Hand 2 View Right  Result Date: 03/18/2020 X-ray right hand 2 views Radiocarpal carpal joint spaces appear preserved.  Some cystic changes in carpal bones.  MCP joints are well preserved.  Severe osteoarthritis changes seen in second and fifth PIP joints with osteophytes and irregular joint space loss.  DIP joint OA most severe at third digit joint appears fused.  No bone erosions seen no significant particular osteopenia.  No soft tissue swelling seen. Impression Advanced OA in the PIP and DIP joints with large osteophytes and extensive remodeling no inflammatory abnormalities noted   Recent Labs: No results found for: WBC, HGB, PLT, NA, K, CL, CO2, GLUCOSE, BUN, CREATININE, BILITOT, ALKPHOS, AST, ALT, PROT, ALBUMIN, CALCIUM, GFRAA, QFTBGOLD, QFTBGOLDPLUS  Speciality Comments: No specialty comments available.  Procedures:  No procedures performed Allergies: Oxycodone, Oxycontin [oxycodone  hcl], and Tramadol   Assessment / Plan:     Visit Diagnoses: Positive ANA (antinuclear antibody) Herpes Simplex  Repeat confirmatory testing negative for dsDNA and does not appear consistent with SLE. Current symptoms sound prodromal with associated HSV flare. She does have a history of thyroid nodules and low positive thyroglobulin Abs could be a cause of nonspecific ANA positivity.  Subclinical hyperthyroidism Thyroid nodule - Plan: TSH, T4, T3  Check TSH, T3, T4 for any evidence of activity associated with positive Abs. If elevated would benefit from endocrine evaluation. Currently not describing weight loss, heat intolerance, hair thinning, palpitations, or insomnia so likely at or near normal.  Orders: Orders Placed This Encounter  Procedures   TSH   T4   T3   No orders of the defined types were placed in this encounter.   Follow-Up Instructions: No follow-ups on file.   03/20/2020, MD  Note - This record has been created using [employer].  Chart creation errors  have been sought, but may not always  have been located. Such creation errors do not reflect on  the standard of medical care.

## 2020-04-06 NOTE — Patient Instructions (Signed)
Your confirmatory test for the dsDNA antibodies is negative, so I do not suspect your positive ANA test to indicate systemic lupus.  Your hand xrays demonstrate osteoarthritis changes; there is no erosive disease change such as rheumatoid arthritis.  Your antibody tests do show anti-thyroglobulin antibodies these can be seen in autoimmune thyroid disease. We will check TSH, T3, and T4 levels today looking for any evidence that there is change in function from this.

## 2020-04-07 LAB — TSH: TSH: 1.45 mIU/L (ref 0.40–4.50)

## 2020-04-07 LAB — T3: T3, Total: 101 ng/dL (ref 76–181)

## 2020-04-07 LAB — T4: T4, Total: 6.8 ug/dL (ref 5.1–11.9)

## 2020-04-13 DIAGNOSIS — R928 Other abnormal and inconclusive findings on diagnostic imaging of breast: Secondary | ICD-10-CM | POA: Diagnosis not present

## 2020-04-13 DIAGNOSIS — R922 Inconclusive mammogram: Secondary | ICD-10-CM | POA: Diagnosis not present

## 2020-04-13 NOTE — Progress Notes (Signed)
Thyroid function tests are all normal, so the positive antibodies against thyroid gland do not seem to be affecting anything at this time. I did not see evidence for lupus or autoimmune disease just a lot of osteoarthritis so no specific rheumatology treatment required at this time.

## 2020-04-20 DIAGNOSIS — R921 Mammographic calcification found on diagnostic imaging of breast: Secondary | ICD-10-CM | POA: Diagnosis not present

## 2020-04-20 DIAGNOSIS — N6002 Solitary cyst of left breast: Secondary | ICD-10-CM | POA: Diagnosis not present

## 2020-04-29 DIAGNOSIS — Z20822 Contact with and (suspected) exposure to covid-19: Secondary | ICD-10-CM | POA: Diagnosis not present

## 2020-04-29 DIAGNOSIS — U071 COVID-19: Secondary | ICD-10-CM | POA: Diagnosis not present

## 2020-07-11 DIAGNOSIS — Z6832 Body mass index (BMI) 32.0-32.9, adult: Secondary | ICD-10-CM | POA: Diagnosis not present

## 2020-07-11 DIAGNOSIS — M25562 Pain in left knee: Secondary | ICD-10-CM | POA: Diagnosis not present

## 2020-08-19 DIAGNOSIS — L259 Unspecified contact dermatitis, unspecified cause: Secondary | ICD-10-CM | POA: Diagnosis not present

## 2020-08-19 DIAGNOSIS — Z6832 Body mass index (BMI) 32.0-32.9, adult: Secondary | ICD-10-CM | POA: Diagnosis not present

## 2020-10-07 IMAGING — US US THYROID
1 series · 13 of 25 positions shown · non-contrast
Comparison: 11/05/2017 and previous back to 02/22/2014

CLINICAL DATA: Nodule on physical exam . Previous FNA biopsy of
right posteroinferior nodule 11/20/2013.

EXAM:
THYROID ULTRASOUND
TECHNIQUE: Ultrasound examination of the thyroid gland and adjacent soft
tissues was performed.

[Series 1: us thyroid · 0.06mm/px · 13 of 42 slices shown]
[im 1/42]
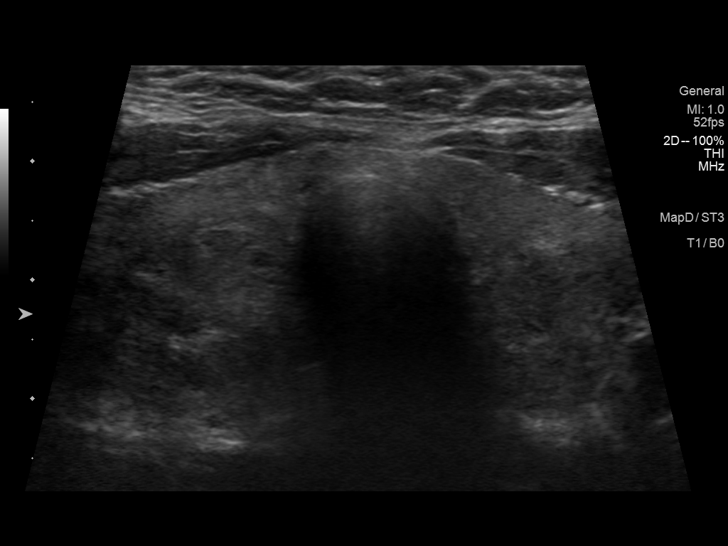
[im 4/42]
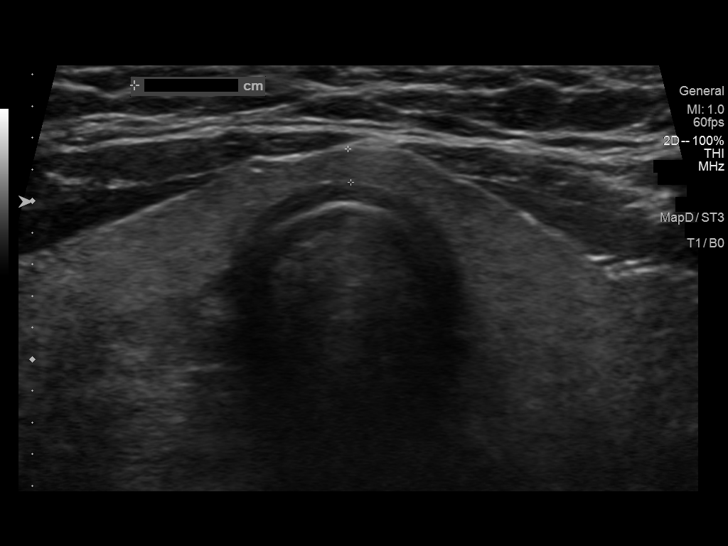
[im 7/42]
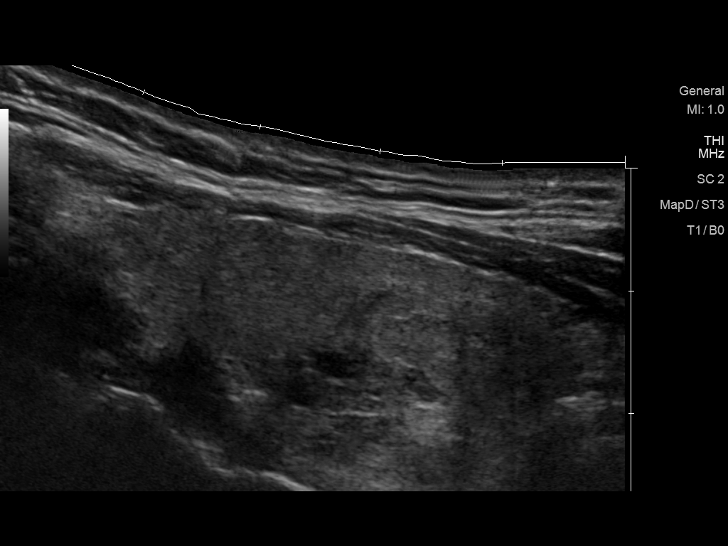
[im 11/42]
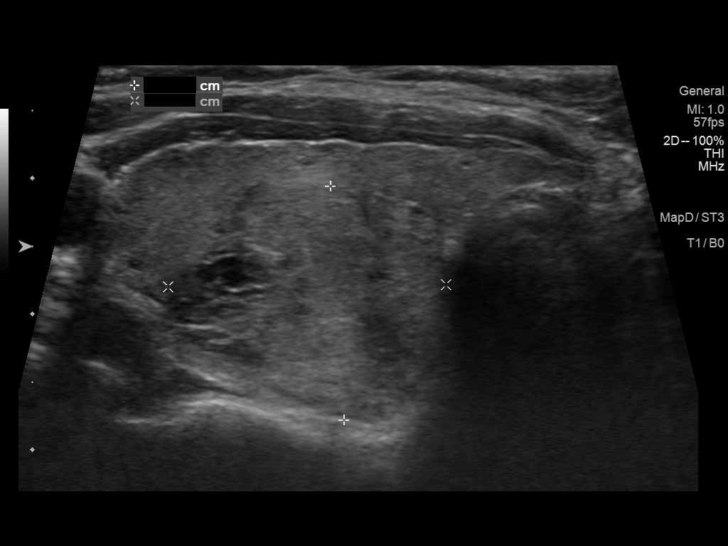
[im 14/42]
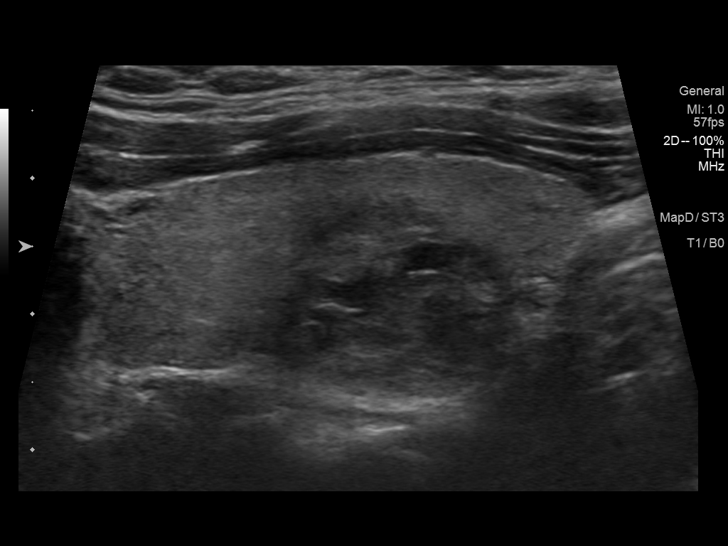
[im 18/42]
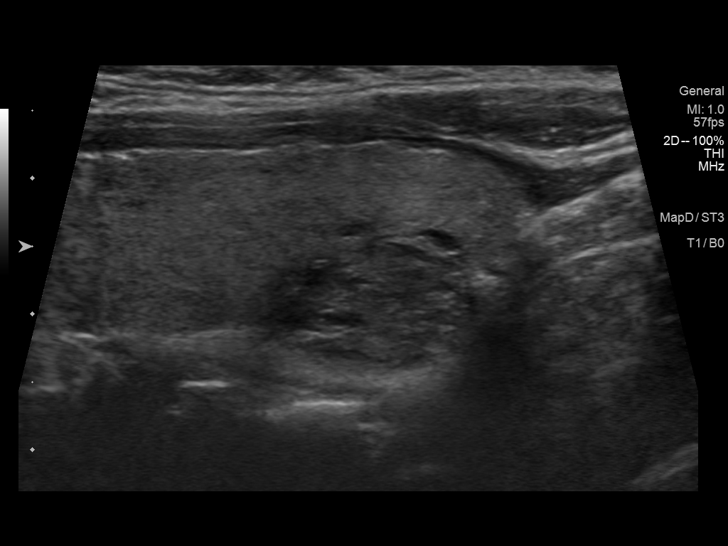
[im 21/42]
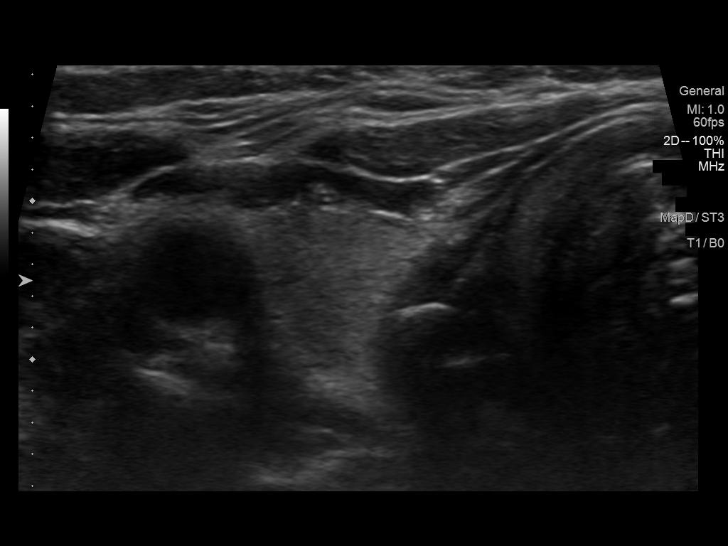
[im 24/42]
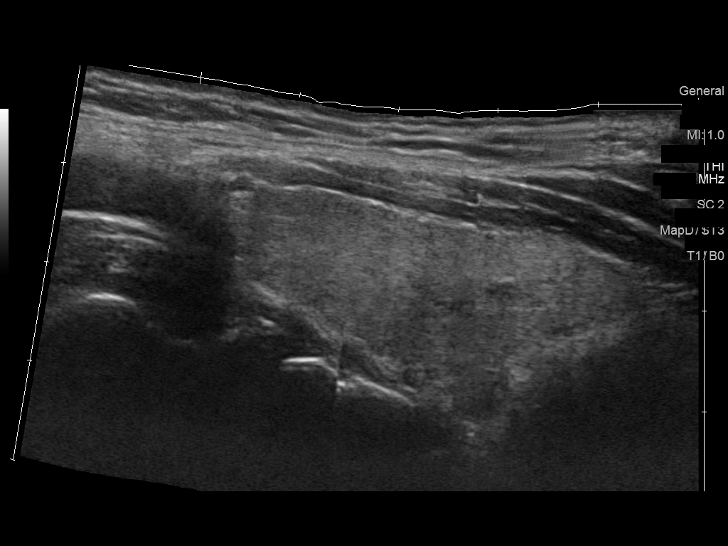
[im 28/42]
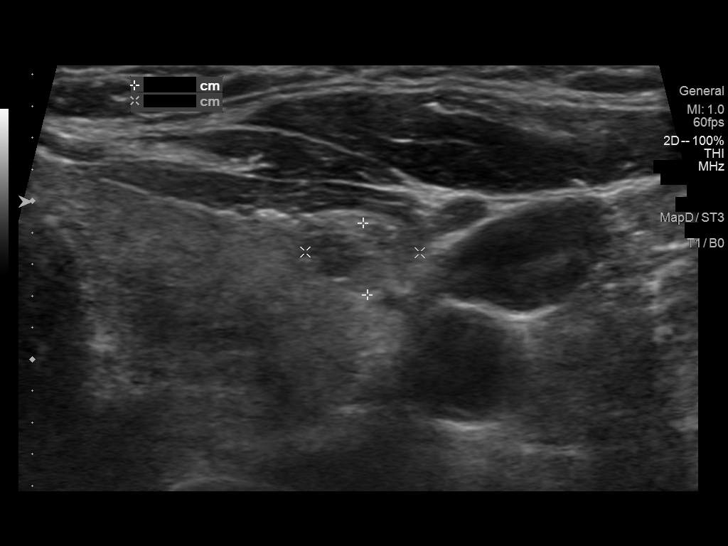
[im 31/42]
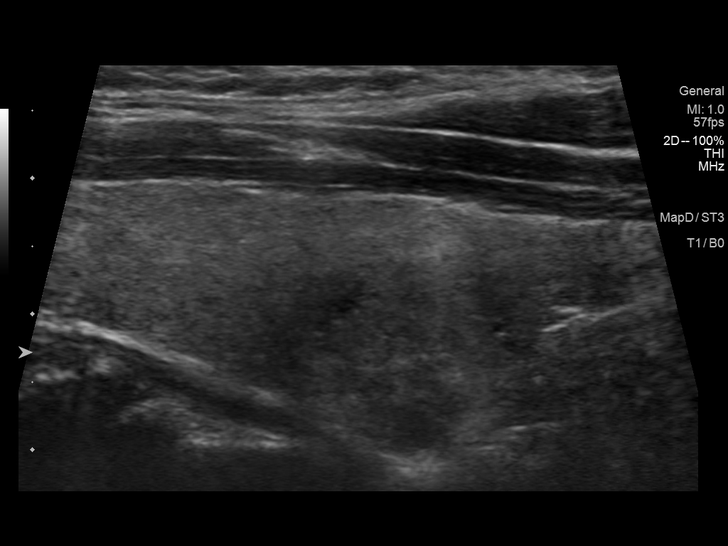
[im 35/42]
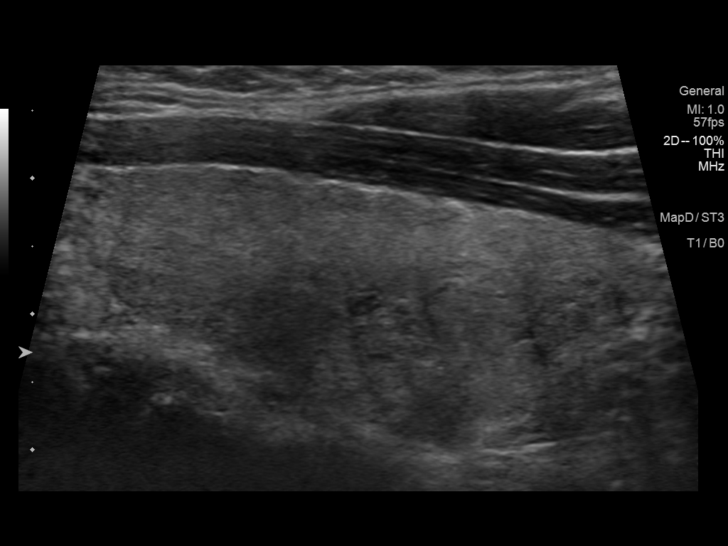
[im 38/42]
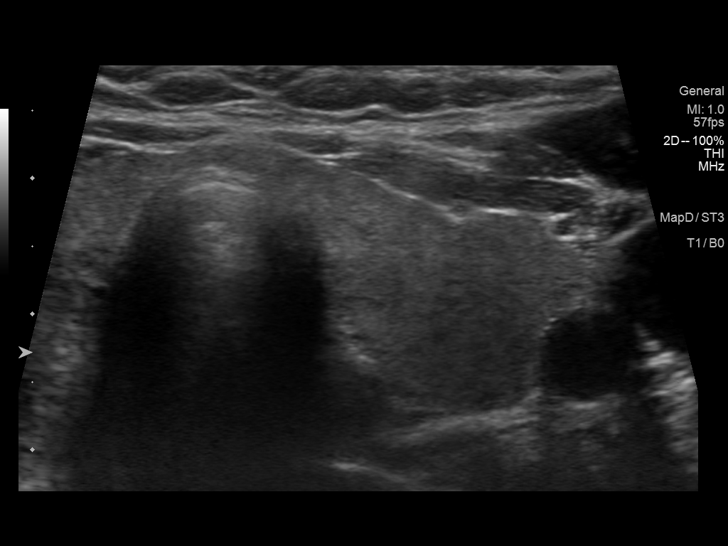
[im 42/42]
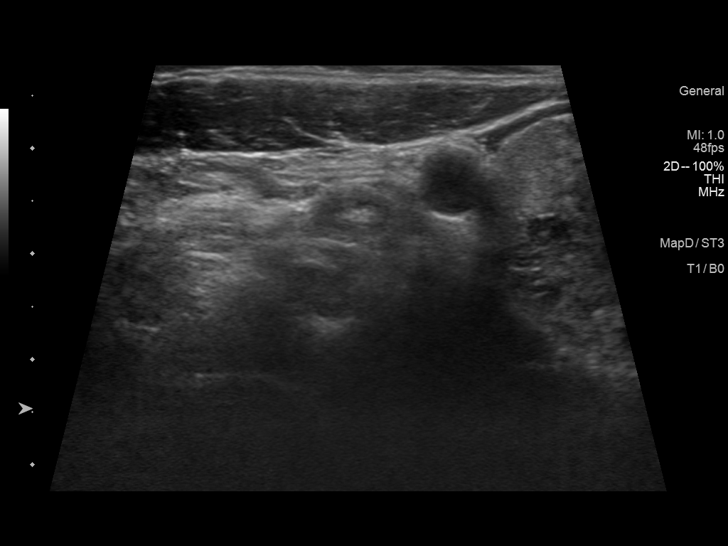

[13 of 25 positions shown; findings below may reference images not displayed]

FINDINGS: Parenchymal Echotexture: Mildly heterogenous

Isthmus: 0.2 cm thickness, stable

Right lobe: 4.4 x 1.5 x 1.9 cm, previously 4.6 x 2 x

Left lobe: 4.4 x 1.9 x 1.9 cm, previously 4.8 x 1.8 x

_________________________________________________________

Estimated total number of nodules >/= 1 cm: 3

Number of spongiform nodules >/=  2 cm not described below (TR1): 0

Number of mixed cystic and solid nodules >/= 1.5 cm not described
below (TR2): 0

_________________________________________________________

2.4 cm inferior right nodule; this was previously biopsied

1 x 0.7 x 0.5 cm mid left nodule, previously 0.9 x 0.7 x 0.4 on
02/22/2014; stability for greater than 5 years implies benignity;
This nodule does NOT meet TI-RADS criteria for biopsy or dedicated
follow-up.

Nodule # 3:

Prior biopsy: No

Location: Left; Inferior

Maximum size: 1.7 cm; Other 2 dimensions: 1.3 x 1.2 cm, previously,
1.7 x 1.4 x 1.1 cm

Composition: solid/almost completely solid (2)

Echogenicity: isoechoic (1)

Shape: not taller-than-wide (0)

Margins: ill-defined (0)

Echogenic foci: none (0)

ACR TI-RADS total points: 3.

ACR TI-RADS risk category:  TR3 (3 points).

Significant change in size (>/= 20% in two dimensions and minimal
increase of 2 mm): No

Change in features: No

Change in ACR TI-RADS risk category: No

ACR TI-RADS recommendations:

*Given size (>/= 1.5 - 2.4 cm) and appearance, a follow-up
ultrasound in 1 year should be considered based on TI-RADS criteria.
IMPRESSION: 1. Normal-sized thyroid with bilateral nodules. None meets criteria
for biopsy.
2. Recommend annual/biennial ultrasound follow-up of inferior left
nodule as above, until stability x5 years confirmed.

The above is in keeping with the ACR TI-RADS recommendations - [HOSPITAL] 4680;[DATE].

## 2020-11-16 DIAGNOSIS — N6012 Diffuse cystic mastopathy of left breast: Secondary | ICD-10-CM | POA: Diagnosis not present

## 2020-11-16 DIAGNOSIS — R928 Other abnormal and inconclusive findings on diagnostic imaging of breast: Secondary | ICD-10-CM | POA: Diagnosis not present

## 2021-02-10 DIAGNOSIS — N951 Menopausal and female climacteric states: Secondary | ICD-10-CM | POA: Diagnosis not present

## 2021-02-10 DIAGNOSIS — E042 Nontoxic multinodular goiter: Secondary | ICD-10-CM | POA: Diagnosis not present

## 2021-02-10 DIAGNOSIS — E785 Hyperlipidemia, unspecified: Secondary | ICD-10-CM | POA: Diagnosis not present

## 2021-02-10 DIAGNOSIS — I10 Essential (primary) hypertension: Secondary | ICD-10-CM | POA: Diagnosis not present

## 2021-02-10 DIAGNOSIS — Z6833 Body mass index (BMI) 33.0-33.9, adult: Secondary | ICD-10-CM | POA: Diagnosis not present

## 2021-02-10 DIAGNOSIS — E669 Obesity, unspecified: Secondary | ICD-10-CM | POA: Diagnosis not present

## 2021-02-10 DIAGNOSIS — R7303 Prediabetes: Secondary | ICD-10-CM | POA: Diagnosis not present

## 2021-02-10 DIAGNOSIS — Z2821 Immunization not carried out because of patient refusal: Secondary | ICD-10-CM | POA: Diagnosis not present

## 2021-02-10 DIAGNOSIS — M8580 Other specified disorders of bone density and structure, unspecified site: Secondary | ICD-10-CM | POA: Diagnosis not present

## 2021-03-02 DIAGNOSIS — H66002 Acute suppurative otitis media without spontaneous rupture of ear drum, left ear: Secondary | ICD-10-CM | POA: Diagnosis not present

## 2021-03-02 DIAGNOSIS — H608X2 Other otitis externa, left ear: Secondary | ICD-10-CM | POA: Diagnosis not present

## 2021-03-07 DIAGNOSIS — E042 Nontoxic multinodular goiter: Secondary | ICD-10-CM | POA: Diagnosis not present

## 2021-03-24 DIAGNOSIS — Z85828 Personal history of other malignant neoplasm of skin: Secondary | ICD-10-CM | POA: Diagnosis not present

## 2021-03-24 DIAGNOSIS — D2361 Other benign neoplasm of skin of right upper limb, including shoulder: Secondary | ICD-10-CM | POA: Diagnosis not present

## 2021-03-24 DIAGNOSIS — L218 Other seborrheic dermatitis: Secondary | ICD-10-CM | POA: Diagnosis not present

## 2021-03-24 DIAGNOSIS — L820 Inflamed seborrheic keratosis: Secondary | ICD-10-CM | POA: Diagnosis not present

## 2021-03-24 DIAGNOSIS — Z85820 Personal history of malignant melanoma of skin: Secondary | ICD-10-CM | POA: Diagnosis not present

## 2021-03-24 DIAGNOSIS — D2371 Other benign neoplasm of skin of right lower limb, including hip: Secondary | ICD-10-CM | POA: Diagnosis not present

## 2021-03-24 DIAGNOSIS — D1801 Hemangioma of skin and subcutaneous tissue: Secondary | ICD-10-CM | POA: Diagnosis not present

## 2021-04-07 DIAGNOSIS — U071 COVID-19: Secondary | ICD-10-CM | POA: Diagnosis not present

## 2021-07-03 DIAGNOSIS — I10 Essential (primary) hypertension: Secondary | ICD-10-CM | POA: Diagnosis not present

## 2021-07-03 DIAGNOSIS — J00 Acute nasopharyngitis [common cold]: Secondary | ICD-10-CM | POA: Diagnosis not present

## 2021-07-28 DIAGNOSIS — Z6834 Body mass index (BMI) 34.0-34.9, adult: Secondary | ICD-10-CM | POA: Diagnosis not present

## 2021-07-28 DIAGNOSIS — M25541 Pain in joints of right hand: Secondary | ICD-10-CM | POA: Diagnosis not present

## 2021-07-28 DIAGNOSIS — M25542 Pain in joints of left hand: Secondary | ICD-10-CM | POA: Diagnosis not present

## 2021-08-07 NOTE — Progress Notes (Signed)
Office Visit Note  Patient: Connie Cummings             Date of Birth: 1961/02/16           MRN: [id number]             PCP: Mateo Flow, MD Referring: Mateo Flow, MD Visit Date: 08/18/2021   Subjective:   History of Present Illness: Connie Cummings is a 61 y.o. female here for follow up for arthralgias and positive ANA. Since our last visit symptoms are worsening she notices increased swelling in finger joints coming and going. Currently has a nodule on her right ring finger that is new. She also feels numbness throughout her arms sometimes in just the hand sometimes radiating all the way form shoulder. Not provoked with specific activities, commonly at night. She was prescribed celebrex for the hand pain currently which is partially helpful.  Previous HPI 04/06/2020 Connie Cummings is a 61 y.o. female here for follow up of arthralgias and positive ANA. She reports increased symptoms and also has lab results for review. Since the last visit she continues to have fatigue and had an episode of increased eye dryness, headaches, and joint pains since about 3-4 days ago but partially improved on its own and then she had outbreak of HSV rash on the left side of face for which she took acyclovir. Review of labs from last visit were negative for dsDNA on confirmatory testing. Thyroglobulin Abs were equivocal. Xrays demonstrated degenerative changes without demineralization or erosions.   [facility] CTD Test Report ANA 1:320 dsDNA by crithidia negative Thyroglobulin Ab 44   Review of Systems  Constitutional:  Positive for fatigue.  HENT:  Negative for mouth sores, mouth dryness and nose dryness.   Eyes:  Positive for itching and visual disturbance. Negative for pain and dryness.  Respiratory:  Negative for shortness of breath and difficulty breathing.   Cardiovascular:  Negative for chest pain and palpitations.  Gastrointestinal:  Negative for blood in stool, constipation and diarrhea.   Endocrine: Negative for increased urination.  Genitourinary:  Negative for difficulty urinating.  Musculoskeletal:  Positive for joint pain, joint pain, joint swelling and morning stiffness. Negative for myalgias, muscle tenderness and myalgias.  Skin:  Negative for color change, rash and redness.  Allergic/Immunologic: Negative for susceptible to infections.  Neurological:  Positive for dizziness, numbness and memory loss. Negative for headaches and weakness.  Hematological:  Negative for bruising/bleeding tendency.  Psychiatric/Behavioral:  Positive for confusion.    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    Past Medical History:  Diagnosis Date   Cancer ([place])    melanoma   Hypertension    Kyphosis    Scoliosis    Thyroid nodule    Per patient    Family History  Problem Relation Age of Onset   Thyroid disease Mother    Colon polyps Sister    Thyroid disease Sister    Heart disease Father    Heart disease Paternal Grandmother    Diabetes Paternal Uncle    Diabetes Paternal Aunt    Heart disease Paternal Aunt    Stroke Daughter    Celiac disease Daughter    Healthy Daughter  Thyroid disease Daughter    Healthy Son    Past Surgical History:  Procedure Laterality Date   APPENDECTOMY     BACK SURGERY  2016   scoliosis and kyphosis so rods and screws in back   CESAREAN SECTION     COLONOSCOPY  01/19/2013   Small internal hemorrhoids. Otherwise normal colonoscopy to terminal ileum    TONSILLECTOMY     Social History   Social History Narrative   Not on file   Immunization History  Administered Date(s) Administered   PFIZER(Purple Top)SARS-COV-2 Vaccination 06/17/2019,  07/08/2019     Objective: Vital Signs: BP (!) 145/84 (BP Location: Left Arm, Patient Position: Sitting, Cuff Size: Normal)   Pulse 76   Ht '5\' 5"'$  (1.651 m)   Wt 207 lb 9.6 oz (94.2 kg)   BMI 34.55 kg/m    Physical Exam Constitutional:      Appearance: She is obese.  Skin:    General: Skin is warm and dry.  Neurological:     General: No focal deficit present.     Mental Status: She is alert.  Psychiatric:        Mood and Affect: Mood normal.     Musculoskeletal Exam:  Neck full ROM no tenderness Shoulders full ROM no tenderness or swelling Elbows full ROM, right elbow tenderness to pressure some pain with movement Wrists full ROM no tenderness or swelling Fingers heberdon;s nodes in bilateral fingers worst 2nd digits with decreased flexion ROM, right 4th PIP cyst present   CDAI Exam: CDAI Score: -- Patient Global: --; Provider Global: -- Swollen: 1 ; Tender: 3  Joint Exam 08/18/2021      Right  Left  Elbow   Tender     PIP 4  Swollen Tender     PIP 5   Tender        Investigation: No additional findings.  Imaging: No results found.  Recent Labs: No results found for: WBC, HGB, PLT, NA, K, CL, CO2, GLUCOSE, BUN, CREATININE, BILITOT, ALKPHOS, AST, ALT, PROT, ALBUMIN, CALCIUM, GFRAA, QFTBGOLD, QFTBGOLDPLUS  Speciality Comments: No specialty comments available.  Procedures:  No procedures performed Allergies: Oxycodone, Oxycontin [oxycodone hcl], and Tramadol   Assessment / Plan:     Visit Diagnoses: Bilateral hand pain - Plan: hydroxychloroquine (PLAQUENIL) 200 MG tablet, Ambulatory referral to Ophthalmology  Previous findings mostly suggestive for osteoarthritis but is describing some areas of swelling and nodules on the fingers.  There is active swelling in at least 1 finger on exam today.  Recommend trial of starting hydroxychloroquine and go up to 400 mg daily for possible seronegative arthritis.  Referral to ophthalmology for baseline exam need for  retinal toxicity monitoring on hydroxychloroquine.  Recommending nerve conduction study for symptoms suspicious of possible carpal tunnel syndrome.  Positive ANA (antinuclear antibody)   Still does not demonstrate clinical criteria or specific antibodies suggestive for systemic lupus.  Addition of medication for suspected possible seronegative RA process as above.   Orders: Orders Placed This Encounter  Procedures   Ambulatory referral to Ophthalmology   Meds ordered this encounter  Medications   hydroxychloroquine (PLAQUENIL) 200 MG tablet    Sig: Take 2 tablets (400 mg total) by mouth daily.    Dispense:  60 tablet    Refill:  2     Follow-Up Instructions: Return in about 3 months (around 11/18/2021) for ?RA/OA NSAID/HCQ start f/u 38mo.   CCollier Salina MD  Note - This record has been created using Dragon  software.  Chart creation errors have been sought, but may not always  have been located. Such creation errors do not reflect on  the standard of medical care.  

## 2021-08-18 ENCOUNTER — Encounter: Payer: Self-pay | Admitting: Internal Medicine

## 2021-08-18 ENCOUNTER — Ambulatory Visit: Payer: 59 | Admitting: Internal Medicine

## 2021-08-18 VITALS — BP 145/84 | HR 76 | Ht 65.0 in | Wt 207.6 lb

## 2021-08-18 DIAGNOSIS — R768 Other specified abnormal immunological findings in serum: Secondary | ICD-10-CM

## 2021-08-18 DIAGNOSIS — B009 Herpesviral infection, unspecified: Secondary | ICD-10-CM | POA: Diagnosis not present

## 2021-08-18 DIAGNOSIS — E0590 Thyrotoxicosis, unspecified without thyrotoxic crisis or storm: Secondary | ICD-10-CM | POA: Diagnosis not present

## 2021-08-18 DIAGNOSIS — M79641 Pain in right hand: Secondary | ICD-10-CM

## 2021-08-18 DIAGNOSIS — E041 Nontoxic single thyroid nodule: Secondary | ICD-10-CM | POA: Diagnosis not present

## 2021-08-18 DIAGNOSIS — M79642 Pain in left hand: Secondary | ICD-10-CM | POA: Diagnosis not present

## 2021-08-18 MED ORDER — HYDROXYCHLOROQUINE SULFATE 200 MG PO TABS
400.0000 mg | ORAL_TABLET | Freq: Every day | ORAL | 2 refills | Status: DC
Start: 2021-08-18 — End: 2021-12-05

## 2021-08-18 NOTE — Patient Instructions (Signed)
Hydroxychloroquine Tablets ?What is this medication? ?HYDROXYCHLOROQUINE (hye drox ee KLOR oh kwin) treats autoimmune conditions, such as rheumatoid arthritis and lupus. It works by slowing down an overactive immune system. It may also be used to prevent and treat malaria. It works by killing the parasite that causes malaria. It belongs to a group of medications called DMARDs. ?This medicine may be used for other purposes; ask your health care provider or pharmacist if you have questions. ?COMMON BRAND NAME(S): Plaquenil, Quineprox ?What should I tell my care team before I take this medication? ?They need to know if you have any of these conditions: ?Diabetes ?Eye disease, vision problems ?G6PD deficiency ?Heart disease ?History of irregular heartbeat ?If you often drink alcohol ?Kidney disease ?Liver disease ?Porphyria ?Psoriasis ?An unusual or allergic reaction to chloroquine, hydroxychloroquine, other medications, foods, dyes, or preservatives ?Pregnant or trying to get pregnant ?Breast-feeding ?How should I use this medication? ?Take this medication by mouth with a glass of water. Take it as directed on the prescription label. Do not cut, crush or chew this medication. Swallow the tablets whole. Take it with food. Do not take it more than directed. Take all of this medication unless your care team tells you to stop it early. Keep taking it even if you think you are better. ?Take products with antacids in them at a different time of day than this medication. Take this medication 4 hours before or 4 hours after antacids. Talk to your care team if you have questions. ?Talk to your care team about the use of this medication in children. While this medication may be prescribed for selected conditions, precautions do apply. ?Overdosage: If you think you have taken too much of this medicine contact a poison control center or emergency room at once. ?NOTE: This medicine is only for you. Do not share this medicine with  others. ?What if I miss a dose? ?If you miss a dose, take it as soon as you can. If it is almost time for your next dose, take only that dose. Do not take double or extra doses. ?What may interact with this medication? ?Do not take this medication with any of the following: ?Cisapride ?Dronedarone ?Pimozide ?Thioridazine ?This medication may also interact with the following: ?Ampicillin ?Antacids ?Cimetidine ?Cyclosporine ?Digoxin ?Kaolin ?Medications for diabetes, like insulin, glipizide, glyburide ?Medications for seizures like carbamazepine, phenobarbital, phenytoin ?Mefloquine ?Methotrexate ?Other medications that prolong the QT interval (cause an abnormal heart rhythm) ?Praziquantel ?This list may not describe all possible interactions. Give your health care provider a list of all the medicines, herbs, non-prescription drugs, or dietary supplements you use. Also tell them if you smoke, drink alcohol, or use illegal drugs. Some items may interact with your medicine. ?What should I watch for while using this medication? ?Visit your care team for regular checks on your progress. Tell your care team if your symptoms do not start to get better or if they get worse. ?You may need blood work done while you are taking this medication. If you take other medications that can affect heart rhythm, you may need more testing. Talk to your care team if you have questions. ?Your vision may be tested before and during use of this medication. Tell your care team right away if you have any change in your eyesight. ?This medication may cause serious skin reactions. They can happen weeks to months after starting the medication. Contact your care team right away if you notice fevers or flu-like symptoms with a rash. The   rash may be red or purple and then turn into blisters or peeling of the skin. Or, you might notice a red rash with swelling of the face, lips or lymph nodes in your neck or under your arms. ?If you or your family  notice any changes in your behavior, such as new or worsening depression, thoughts of harming yourself, anxiety, or other unusual or disturbing thoughts, or memory loss, call your care team right away. ?What side effects may I notice from receiving this medication? ?Side effects that you should report to your care team as soon as possible: ?Allergic reactions--skin rash, itching, hives, swelling of the face, lips, tongue, or throat ?Aplastic anemia--unusual weakness or fatigue, dizziness, headache, trouble breathing, increased bleeding or bruising ?Change in vision ?Heart rhythm changes--fast or irregular heartbeat, dizziness, feeling faint or lightheaded, chest pain, trouble breathing ?Infection--fever, chills, cough, or sore throat ?Low blood sugar (hypoglycemia)--tremors or shaking, anxiety, sweating, cold or clammy skin, confusion, dizziness, rapid heartbeat ?Muscle injury--unusual weakness or fatigue, muscle pain, dark yellow or brown urine, decrease in amount of urine ?Pain, tingling, or numbness in the hands or feet ?Rash, fever, and swollen lymph nodes ?Redness, blistering, peeling, or loosening of the skin, including inside the mouth ?Thoughts of suicide or self-harm, worsening mood, or feelings of depression ?Unusual bruising or bleeding ?Side effects that usually do not require medical attention (report to your care team if they continue or are bothersome): ?Diarrhea ?Headache ?Nausea ?Stomach pain ?Vomiting ?This list may not describe all possible side effects. Call your doctor for medical advice about side effects. You may report side effects to FDA at 1-800-FDA-1088. ?Where should I keep my medication? ?Keep out of the reach of children and pets. ?Store at room temperature up to 30 degrees C (86 degrees F). Protect from light. Get rid of any unused medication after the expiration date. ?To get rid of medications that are no longer needed or have expired: ?Take the medication to a medication take-back  program. Check with your pharmacy or law enforcement to find a location. ?If you cannot return the medication, check the label or package insert to see if the medication should be thrown out in the garbage or flushed down the toilet. If you are not sure, ask your care team. If it is safe to put it in the trash, empty the medication out of the container. Mix the medication with cat litter, dirt, coffee grounds, or other unwanted substance. Seal the mixture in a bag or container. Put it in the trash. ?NOTE: This sheet is a summary. It may not cover all possible information. If you have questions about this medicine, talk to your doctor, pharmacist, or health care provider. ?? 2023 Elsevier/Gold Standard (2020-08-04 00:00:00) ? ?

## 2021-08-22 ENCOUNTER — Telehealth: Payer: Self-pay | Admitting: Internal Medicine

## 2021-08-22 NOTE — Telephone Encounter (Signed)
Patient called the office stating she had received a call from [facility] regarding a physical therapy referral from Dr. Benjamine Mola. Patient states she she doesn't know about a physical therapy referral and thought it was for a nerve conduction. Patient requests a call back to sort this out.

## 2021-08-23 ENCOUNTER — Telehealth: Payer: Self-pay | Admitting: Internal Medicine

## 2021-08-23 NOTE — Telephone Encounter (Signed)
[facility] called the office regarding a referral they received for the patient. They called to inform us that they no longer do Nerve conductions. That goes to neurology and spine "upstairs". [phone]

## 2021-08-23 NOTE — Telephone Encounter (Signed)
I called [facility], office will call patient to schedule NCV not PT, patient is aware.

## 2021-08-25 ENCOUNTER — Telehealth: Payer: Self-pay | Admitting: Internal Medicine

## 2021-08-25 NOTE — Telephone Encounter (Signed)
Patient called the office stating she reached out to [facility] to schedule a nerve conduction. They informed her they no long do those studies because the doctor who did them left the practice.

## 2021-08-25 NOTE — Telephone Encounter (Signed)
Referral change to [facility], I called patient.

## 2021-08-29 ENCOUNTER — Telehealth: Payer: Self-pay | Admitting: Internal Medicine

## 2021-08-29 NOTE — Telephone Encounter (Signed)
Leah at [facility] left a voicemail regarding the referral for EMG nerve conduction study for the patient.  Unfortunately we only had one physician here who did nerve conduction studies and his last day was 07/05/21.  If you have any questions, please call back at # [phone]

## 2021-08-29 NOTE — Telephone Encounter (Signed)
Ivin Booty aware and has already changed referral to [facility].

## 2021-09-22 DIAGNOSIS — R200 Anesthesia of skin: Secondary | ICD-10-CM | POA: Diagnosis not present

## 2021-10-11 DIAGNOSIS — G5602 Carpal tunnel syndrome, left upper limb: Secondary | ICD-10-CM | POA: Diagnosis not present

## 2021-10-11 DIAGNOSIS — G5601 Carpal tunnel syndrome, right upper limb: Secondary | ICD-10-CM | POA: Diagnosis not present

## 2021-10-12 ENCOUNTER — Other Ambulatory Visit: Payer: Self-pay | Admitting: *Deleted

## 2021-10-12 ENCOUNTER — Telehealth: Payer: Self-pay | Admitting: Internal Medicine

## 2021-10-12 NOTE — Telephone Encounter (Signed)
Patient called the office requesting a referral be sent to [facility] so she can get a plaquenil eye exam.  Fax# [phone].

## 2021-10-12 NOTE — Telephone Encounter (Signed)
Referral location changed and refaxed, pending appt

## 2021-11-16 NOTE — Progress Notes (Signed)
Office Visit Note  Patient: Connie Cummings             Date of Birth: 09/16/1960           MRN: [id number]             PCP: Mateo Flow, MD Referring: Mateo Flow, MD Visit Date: 11/24/2021   Subjective:  Follow-up (Arthritis in hands and carpal tunnel.)   History of Present Illness: Connie Cummings is a 61 y.o. 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  She decreased the hydroxychloroquine to 200 mg daily due to intolerance with the 2 tablets.  Is also getting benefit with as needed Celebrex for the pain and stiffness.  Some issues with the nerve conduction study to do not have a definitive answer regarding median nerve impingement concern.   Previous HPI 08/18/2021  Connie Cummings is a 61 y.o. female here for follow up for arthralgias and positive ANA. Since our last visit symptoms are worsening she notices increased swelling in finger joints coming and going. Currently has a nodule on her right ring finger that is new. She also feels numbness throughout her arms sometimes in just the hand sometimes radiating all the way form shoulder. Not provoked with specific activities, commonly at night. She was prescribed celebrex for the hand pain currently which is partially helpful.   Previous HPI 04/06/2020 Connie Cummings is a 61 y.o. female here for follow up of arthralgias and positive ANA. She reports increased symptoms and also has lab results for review. Since the last visit she continues to have fatigue and had an episode of increased eye dryness, headaches, and joint pains since about 3-4 days ago but partially improved on its own and then she had outbreak of HSV rash on the left side of face for which she took acyclovir. Review of labs from last visit were negative for dsDNA on confirmatory testing. Thyroglobulin Abs were equivocal. Xrays demonstrated degenerative changes without demineralization or  erosions.   [facility] CTD Test Report ANA 1:320 dsDNA by crithidia negative Thyroglobulin Ab 44     Review of Systems  Constitutional:  Negative for fatigue.  HENT:  Negative for mouth sores and mouth dryness.   Eyes:  Negative for dryness.  Respiratory:  Negative for shortness of breath.   Cardiovascular:  Negative for chest pain and palpitations.  Gastrointestinal:  Negative for blood in stool, constipation and diarrhea.  Endocrine: Negative for increased urination.  Genitourinary:  Positive for involuntary urination.  Musculoskeletal:  Positive for joint pain, joint pain, joint swelling and morning stiffness. Negative for gait problem, myalgias, muscle weakness, muscle tenderness and myalgias.  Skin:  Positive for sensitivity to sunlight. Negative for color change, rash and hair loss.  Allergic/Immunologic: Negative for susceptible to infections.  Neurological:  Negative for dizziness and headaches.  Hematological:  Negative for swollen glands.  Psychiatric/Behavioral:  Positive for sleep disturbance. Negative for depressed mood. The patient is not nervous/anxious.     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    Past Medical History:  Diagnosis Date   Cancer ([facility])    melanoma   Hypertension  Kyphosis    Scoliosis    Thyroid nodule    Per patient    Family History  Problem Relation Age of Onset   Thyroid disease Mother    Colon polyps Sister    Thyroid disease Sister    Heart disease Father    Heart disease Paternal Grandmother    Diabetes Paternal Uncle    Diabetes Paternal Aunt    Heart disease Paternal Aunt    Stroke Daughter    Celiac disease Daughter    Healthy Daughter     Thyroid disease Daughter    Healthy Son    Past Surgical History:  Procedure Laterality Date   APPENDECTOMY     BACK SURGERY  2016   scoliosis and kyphosis so rods and screws in back   CESAREAN SECTION     COLONOSCOPY  01/19/2013   Small internal hemorrhoids. Otherwise normal colonoscopy to terminal ileum    TONSILLECTOMY     Social History   Social History Narrative   Not on file   Immunization History  Administered Date(s) Administered   PFIZER(Purple Top)SARS-COV-2 Vaccination 06/17/2019, 07/08/2019     Objective: Vital Signs: BP 122/79 (BP Location: Left Arm, Patient Position: Sitting, Cuff Size: Large)   Pulse 64   Resp 15   Ht '5\' 6"'$  (1.676 m)   Wt 195 lb (88.5 kg)   BMI 31.47 kg/m    Physical Exam Cardiovascular:     Rate and Rhythm: Normal rate and regular rhythm.  Pulmonary:     Effort: Pulmonary effort is normal.     Breath sounds: Normal breath sounds.  Skin:    General: Skin is warm and dry.  Neurological:     Mental Status: She is alert.  Psychiatric:        Mood and Affect: Mood normal.      Musculoskeletal Exam:  Shoulders full ROM no tenderness or swelling Elbows full ROM no tenderness or swelling Wrists full ROM some tenderness to pressure without joint swelling or warmth Fingers full ROM tenderness to pressure at several PIP joints without palpable synovitis Knees full ROM no tenderness or swelling Ankles full ROM no tenderness or swelling MTPs full ROM no tenderness or swelling   Investigation: No additional findings.  Imaging: No results found.  Recent Labs: Lab Results  Component Value Date   NA 141 11/24/2021   K 4.5 11/24/2021   CL 104 11/24/2021   CO2 26 11/24/2021   GLUCOSE 95 11/24/2021   BUN 14 11/24/2021   CREATININE 0.86 11/24/2021   CALCIUM 9.9 11/24/2021    Speciality Comments: No specialty comments available.  Procedures:  No procedures performed Allergies: Oxycodone, Oxycontin [oxycodone hcl], and Tramadol    Assessment / Plan:     Visit Diagnoses: Positive ANA (antinuclear antibody) - Plan: Sedimentation rate, C-reactive protein  Some benefit in finger swelling is less than before since addition of the hydroxychloroquine.  We will recheck inflammatory markers trying to assess activity.  We will plan to continue hydroxychloroquine 200 mg daily at this time.  Can continue as needed turmeric or NSAIDs or topical treatments as well for hand arthritis.  Bilateral hand pain  Joint inflammation looks mostly under control but continued pain suspicious for carpal tunnel symptoms.  Discussed treatment options including local joint injections or conservative treatment with range of motion exercises and wrist bracing at night.  We will proceed with the braces and range of motion exercises at this time.  High risk medication use - PLAQUENIL  200 MG tablet 2 tablets (400 mg total) by mouth daily. - Plan: BASIC METABOLIC PANEL WITH GFR  Checking basic metabolic panel for monitoring continued use of hydroxychloroquine.  Also needing ophthalmology exam for retinal toxicity screening.  Orders: Orders Placed This Encounter  Procedures   Sedimentation rate   C-reactive protein   BASIC METABOLIC PANEL WITH GFR   No orders of the defined types were placed in this encounter.    Follow-Up Instructions: No follow-ups on file.   Collier Salina, MD  Note - This record has been created using [employer].  Chart creation errors have been sought, but may not always  have been located. Such creation errors do not reflect on  the standard of medical care.

## 2021-11-24 ENCOUNTER — Ambulatory Visit: Payer: 59 | Attending: Internal Medicine | Admitting: Internal Medicine

## 2021-11-24 ENCOUNTER — Encounter: Payer: Self-pay | Admitting: Internal Medicine

## 2021-11-24 VITALS — BP 122/79 | HR 64 | Resp 15 | Ht 66.0 in | Wt 195.0 lb

## 2021-11-24 DIAGNOSIS — B009 Herpesviral infection, unspecified: Secondary | ICD-10-CM

## 2021-11-24 DIAGNOSIS — E0590 Thyrotoxicosis, unspecified without thyrotoxic crisis or storm: Secondary | ICD-10-CM

## 2021-11-24 DIAGNOSIS — M79642 Pain in left hand: Secondary | ICD-10-CM | POA: Diagnosis not present

## 2021-11-24 DIAGNOSIS — R768 Other specified abnormal immunological findings in serum: Secondary | ICD-10-CM

## 2021-11-24 DIAGNOSIS — M79641 Pain in right hand: Secondary | ICD-10-CM | POA: Diagnosis not present

## 2021-11-24 DIAGNOSIS — E041 Nontoxic single thyroid nodule: Secondary | ICD-10-CM

## 2021-11-24 DIAGNOSIS — Z79899 Other long term (current) drug therapy: Secondary | ICD-10-CM

## 2021-11-24 NOTE — Patient Instructions (Signed)
I recommend starting range of motion exercises for carpal tunnel syndrome and starting use of a brace overnight for this problem.  You can buy a brace like this online, at most pharmacies, or at a medical supply store. The main key is that this fits comfortably and prevents flexing your wrist while worn.

## 2021-11-25 LAB — BASIC METABOLIC PANEL WITH GFR
BUN: 14 mg/dL (ref 7–25)
CO2: 26 mmol/L (ref 20–32)
Calcium: 9.9 mg/dL (ref 8.6–10.4)
Chloride: 104 mmol/L (ref 98–110)
Creat: 0.86 mg/dL (ref 0.50–1.03)
Glucose, Bld: 95 mg/dL (ref 65–99)
Potassium: 4.5 mmol/L (ref 3.5–5.3)
Sodium: 141 mmol/L (ref 135–146)
eGFR: 78 mL/min/{1.73_m2} (ref >=60)

## 2021-11-25 LAB — SEDIMENTATION RATE: Sed Rate: 11 mm/h (ref 0–30)

## 2021-11-25 LAB — C-REACTIVE PROTEIN: CRP: 1.4 mg/L (ref <8.0)

## 2021-11-26 NOTE — Progress Notes (Signed)
Lab results look fine markers for inflammation are normal and no effect on kidney function numbers with the hydroxychloroquine and celebrex.

## 2021-12-05 ENCOUNTER — Other Ambulatory Visit: Payer: Self-pay | Admitting: *Deleted

## 2021-12-05 ENCOUNTER — Encounter: Payer: Self-pay | Admitting: *Deleted

## 2021-12-05 DIAGNOSIS — R768 Other specified abnormal immunological findings in serum: Secondary | ICD-10-CM

## 2021-12-05 DIAGNOSIS — M79641 Pain in right hand: Secondary | ICD-10-CM

## 2021-12-05 MED ORDER — HYDROXYCHLOROQUINE SULFATE 200 MG PO TABS: 200.0000 mg | ORAL_TABLET | Freq: Every day | ORAL | 1 refills | Status: DC

## 2021-12-05 NOTE — Telephone Encounter (Signed)
Refill request received via fax from [facility] for [facility].   Next Visit: 05/30/2022  Last Visit: 11/24/2021  Labs: 11/24/2021 Lab results look fine markers for inflammation are normal and no effect on kidney function numbers with the hydroxychloroquine and celebrex.  Eye exam: not on file   Current Dose per office note 11/24/2021: PLAQUENIL 200 MG tablet 2 tablets   DX: Positive ANA (antinuclear antibody)  Last Fill: 08/18/2021  Okay to refill Plaquenil?

## 2021-12-29 DIAGNOSIS — Z1231 Encounter for screening mammogram for malignant neoplasm of breast: Secondary | ICD-10-CM | POA: Diagnosis not present

## 2022-02-16 DIAGNOSIS — Z85828 Personal history of other malignant neoplasm of skin: Secondary | ICD-10-CM | POA: Diagnosis not present

## 2022-02-16 DIAGNOSIS — L821 Other seborrheic keratosis: Secondary | ICD-10-CM | POA: Diagnosis not present

## 2022-02-16 DIAGNOSIS — D1801 Hemangioma of skin and subcutaneous tissue: Secondary | ICD-10-CM | POA: Diagnosis not present

## 2022-02-16 DIAGNOSIS — L218 Other seborrheic dermatitis: Secondary | ICD-10-CM | POA: Diagnosis not present

## 2022-02-16 DIAGNOSIS — D485 Neoplasm of uncertain behavior of skin: Secondary | ICD-10-CM | POA: Diagnosis not present

## 2022-02-16 DIAGNOSIS — L570 Actinic keratosis: Secondary | ICD-10-CM | POA: Diagnosis not present

## 2022-02-16 DIAGNOSIS — D2371 Other benign neoplasm of skin of right lower limb, including hip: Secondary | ICD-10-CM | POA: Diagnosis not present

## 2022-02-16 DIAGNOSIS — D2361 Other benign neoplasm of skin of right upper limb, including shoulder: Secondary | ICD-10-CM | POA: Diagnosis not present

## 2022-02-16 DIAGNOSIS — L814 Other melanin hyperpigmentation: Secondary | ICD-10-CM | POA: Diagnosis not present

## 2022-04-20 DIAGNOSIS — M25571 Pain in right ankle and joints of right foot: Secondary | ICD-10-CM | POA: Diagnosis not present

## 2022-04-20 DIAGNOSIS — S82831A Other fracture of upper and lower end of right fibula, initial encounter for closed fracture: Secondary | ICD-10-CM | POA: Diagnosis not present

## 2022-04-20 DIAGNOSIS — S82401A Unspecified fracture of shaft of right fibula, initial encounter for closed fracture: Secondary | ICD-10-CM | POA: Diagnosis not present

## 2022-04-23 DIAGNOSIS — M25571 Pain in right ankle and joints of right foot: Secondary | ICD-10-CM | POA: Diagnosis not present

## 2022-04-30 DIAGNOSIS — M25571 Pain in right ankle and joints of right foot: Secondary | ICD-10-CM | POA: Diagnosis not present

## 2022-05-09 DIAGNOSIS — F4320 Adjustment disorder, unspecified: Secondary | ICD-10-CM | POA: Diagnosis not present

## 2022-05-17 DIAGNOSIS — F4320 Adjustment disorder, unspecified: Secondary | ICD-10-CM | POA: Diagnosis not present

## 2022-05-23 DIAGNOSIS — F4320 Adjustment disorder, unspecified: Secondary | ICD-10-CM | POA: Diagnosis not present

## 2022-05-30 ENCOUNTER — Ambulatory Visit: Payer: 59 | Admitting: Internal Medicine

## 2022-05-30 DIAGNOSIS — M25571 Pain in right ankle and joints of right foot: Secondary | ICD-10-CM | POA: Diagnosis not present

## 2022-06-06 DIAGNOSIS — M25471 Effusion, right ankle: Secondary | ICD-10-CM | POA: Diagnosis not present

## 2022-06-06 DIAGNOSIS — M25571 Pain in right ankle and joints of right foot: Secondary | ICD-10-CM | POA: Diagnosis not present

## 2022-06-06 DIAGNOSIS — M25671 Stiffness of right ankle, not elsewhere classified: Secondary | ICD-10-CM | POA: Diagnosis not present

## 2022-06-14 DIAGNOSIS — M25571 Pain in right ankle and joints of right foot: Secondary | ICD-10-CM | POA: Diagnosis not present

## 2022-06-14 DIAGNOSIS — M25671 Stiffness of right ankle, not elsewhere classified: Secondary | ICD-10-CM | POA: Diagnosis not present

## 2022-06-14 DIAGNOSIS — M25471 Effusion, right ankle: Secondary | ICD-10-CM | POA: Diagnosis not present

## 2022-06-19 DIAGNOSIS — M25671 Stiffness of right ankle, not elsewhere classified: Secondary | ICD-10-CM | POA: Diagnosis not present

## 2022-06-19 DIAGNOSIS — M25471 Effusion, right ankle: Secondary | ICD-10-CM | POA: Diagnosis not present

## 2022-06-19 DIAGNOSIS — M25571 Pain in right ankle and joints of right foot: Secondary | ICD-10-CM | POA: Diagnosis not present

## 2022-06-25 DIAGNOSIS — M25471 Effusion, right ankle: Secondary | ICD-10-CM | POA: Diagnosis not present

## 2022-06-25 DIAGNOSIS — M25571 Pain in right ankle and joints of right foot: Secondary | ICD-10-CM | POA: Diagnosis not present

## 2022-06-25 DIAGNOSIS — M25671 Stiffness of right ankle, not elsewhere classified: Secondary | ICD-10-CM | POA: Diagnosis not present

## 2022-07-04 DIAGNOSIS — M25671 Stiffness of right ankle, not elsewhere classified: Secondary | ICD-10-CM | POA: Diagnosis not present

## 2022-07-04 DIAGNOSIS — M25571 Pain in right ankle and joints of right foot: Secondary | ICD-10-CM | POA: Diagnosis not present

## 2022-07-04 DIAGNOSIS — M25471 Effusion, right ankle: Secondary | ICD-10-CM | POA: Diagnosis not present

## 2022-07-10 DIAGNOSIS — M25671 Stiffness of right ankle, not elsewhere classified: Secondary | ICD-10-CM | POA: Diagnosis not present

## 2022-07-10 DIAGNOSIS — M25471 Effusion, right ankle: Secondary | ICD-10-CM | POA: Diagnosis not present

## 2022-07-10 DIAGNOSIS — M25571 Pain in right ankle and joints of right foot: Secondary | ICD-10-CM | POA: Diagnosis not present

## 2022-07-11 DIAGNOSIS — M25571 Pain in right ankle and joints of right foot: Secondary | ICD-10-CM | POA: Diagnosis not present

## 2022-08-02 DIAGNOSIS — I10 Essential (primary) hypertension: Secondary | ICD-10-CM | POA: Diagnosis not present

## 2022-08-02 DIAGNOSIS — F418 Other specified anxiety disorders: Secondary | ICD-10-CM | POA: Diagnosis not present

## 2022-08-02 DIAGNOSIS — Z6833 Body mass index (BMI) 33.0-33.9, adult: Secondary | ICD-10-CM | POA: Diagnosis not present

## 2022-08-02 DIAGNOSIS — Z79899 Other long term (current) drug therapy: Secondary | ICD-10-CM | POA: Diagnosis not present

## 2022-08-02 DIAGNOSIS — Z1331 Encounter for screening for depression: Secondary | ICD-10-CM | POA: Diagnosis not present

## 2022-08-02 DIAGNOSIS — Z1339 Encounter for screening examination for other mental health and behavioral disorders: Secondary | ICD-10-CM | POA: Diagnosis not present

## 2022-08-23 DIAGNOSIS — E042 Nontoxic multinodular goiter: Secondary | ICD-10-CM | POA: Diagnosis not present

## 2022-08-23 DIAGNOSIS — K209 Esophagitis, unspecified without bleeding: Secondary | ICD-10-CM | POA: Diagnosis not present

## 2022-08-23 DIAGNOSIS — Z6834 Body mass index (BMI) 34.0-34.9, adult: Secondary | ICD-10-CM | POA: Diagnosis not present

## 2022-09-02 DIAGNOSIS — R059 Cough, unspecified: Secondary | ICD-10-CM | POA: Diagnosis not present

## 2022-09-02 DIAGNOSIS — U071 COVID-19: Secondary | ICD-10-CM | POA: Diagnosis not present

## 2022-09-02 DIAGNOSIS — R509 Fever, unspecified: Secondary | ICD-10-CM | POA: Diagnosis not present

## 2022-09-02 DIAGNOSIS — R531 Weakness: Secondary | ICD-10-CM | POA: Diagnosis not present

## 2022-10-19 DIAGNOSIS — L570 Actinic keratosis: Secondary | ICD-10-CM | POA: Diagnosis not present

## 2022-10-19 DIAGNOSIS — Z85828 Personal history of other malignant neoplasm of skin: Secondary | ICD-10-CM | POA: Diagnosis not present

## 2022-10-19 DIAGNOSIS — L821 Other seborrheic keratosis: Secondary | ICD-10-CM | POA: Diagnosis not present

## 2022-10-30 ENCOUNTER — Telehealth: Payer: Self-pay | Admitting: *Deleted

## 2022-10-30 NOTE — Telephone Encounter (Signed)
Patient contacted the office stating she needs a prescription for PLQ. Patient states she was given a prescription at her last appointment and stopped it because she did not think it was helping. She states she restarted the prescription and can tell it is working. Patient advised she needs updated lab work and an office visit as well as a PLQ eye exam. Patient is to call and schedule eye exam. Patient offered an appointment in our office. 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

## 2023-01-02 DIAGNOSIS — Z6834 Body mass index (BMI) 34.0-34.9, adult: Secondary | ICD-10-CM | POA: Diagnosis not present

## 2023-01-02 DIAGNOSIS — K209 Esophagitis, unspecified without bleeding: Secondary | ICD-10-CM | POA: Diagnosis not present

## 2023-01-02 DIAGNOSIS — Z1231 Encounter for screening mammogram for malignant neoplasm of breast: Secondary | ICD-10-CM | POA: Diagnosis not present

## 2023-01-02 DIAGNOSIS — J209 Acute bronchitis, unspecified: Secondary | ICD-10-CM | POA: Diagnosis not present

## 2023-02-22 DIAGNOSIS — L821 Other seborrheic keratosis: Secondary | ICD-10-CM | POA: Diagnosis not present

## 2023-02-22 DIAGNOSIS — L814 Other melanin hyperpigmentation: Secondary | ICD-10-CM | POA: Diagnosis not present

## 2023-02-22 DIAGNOSIS — D1801 Hemangioma of skin and subcutaneous tissue: Secondary | ICD-10-CM | POA: Diagnosis not present

## 2023-02-22 DIAGNOSIS — L570 Actinic keratosis: Secondary | ICD-10-CM | POA: Diagnosis not present

## 2023-02-22 DIAGNOSIS — L218 Other seborrheic dermatitis: Secondary | ICD-10-CM | POA: Diagnosis not present

## 2023-02-22 DIAGNOSIS — D485 Neoplasm of uncertain behavior of skin: Secondary | ICD-10-CM | POA: Diagnosis not present

## 2023-02-22 DIAGNOSIS — C44629 Squamous cell carcinoma of skin of left upper limb, including shoulder: Secondary | ICD-10-CM | POA: Diagnosis not present

## 2023-02-22 DIAGNOSIS — D2361 Other benign neoplasm of skin of right upper limb, including shoulder: Secondary | ICD-10-CM | POA: Diagnosis not present

## 2023-02-22 DIAGNOSIS — L820 Inflamed seborrheic keratosis: Secondary | ICD-10-CM | POA: Diagnosis not present

## 2023-02-22 DIAGNOSIS — Z85828 Personal history of other malignant neoplasm of skin: Secondary | ICD-10-CM | POA: Diagnosis not present

## 2023-02-22 DIAGNOSIS — L859 Epidermal thickening, unspecified: Secondary | ICD-10-CM | POA: Diagnosis not present

## 2023-03-20 DIAGNOSIS — Z6835 Body mass index (BMI) 35.0-35.9, adult: Secondary | ICD-10-CM | POA: Diagnosis not present

## 2023-03-20 DIAGNOSIS — E042 Nontoxic multinodular goiter: Secondary | ICD-10-CM | POA: Diagnosis not present

## 2023-03-20 DIAGNOSIS — N3941 Urge incontinence: Secondary | ICD-10-CM | POA: Diagnosis not present

## 2023-04-04 DIAGNOSIS — E042 Nontoxic multinodular goiter: Secondary | ICD-10-CM | POA: Diagnosis not present

## 2023-04-12 DIAGNOSIS — H0100A Unspecified blepharitis right eye, upper and lower eyelids: Secondary | ICD-10-CM | POA: Diagnosis not present

## 2023-04-12 DIAGNOSIS — H0100B Unspecified blepharitis left eye, upper and lower eyelids: Secondary | ICD-10-CM | POA: Diagnosis not present

## 2023-04-17 DIAGNOSIS — E041 Nontoxic single thyroid nodule: Secondary | ICD-10-CM | POA: Diagnosis not present

## 2023-04-17 DIAGNOSIS — E0590 Thyrotoxicosis, unspecified without thyrotoxic crisis or storm: Secondary | ICD-10-CM | POA: Diagnosis not present

## 2023-04-17 DIAGNOSIS — I10 Essential (primary) hypertension: Secondary | ICD-10-CM | POA: Diagnosis not present

## 2023-04-18 ENCOUNTER — Other Ambulatory Visit: Payer: Self-pay | Admitting: Endocrinology

## 2023-04-18 DIAGNOSIS — E041 Nontoxic single thyroid nodule: Secondary | ICD-10-CM

## 2023-05-03 DIAGNOSIS — E042 Nontoxic multinodular goiter: Secondary | ICD-10-CM | POA: Diagnosis not present

## 2023-08-31 DIAGNOSIS — S838X2A Sprain of other specified parts of left knee, initial encounter: Secondary | ICD-10-CM | POA: Diagnosis not present

## 2023-08-31 DIAGNOSIS — M1712 Unilateral primary osteoarthritis, left knee: Secondary | ICD-10-CM | POA: Diagnosis not present

## 2023-09-13 DIAGNOSIS — M1712 Unilateral primary osteoarthritis, left knee: Secondary | ICD-10-CM | POA: Diagnosis not present

## 2023-09-26 DIAGNOSIS — H6690 Otitis media, unspecified, unspecified ear: Secondary | ICD-10-CM | POA: Diagnosis not present

## 2023-10-02 ENCOUNTER — Encounter (HOSPITAL_BASED_OUTPATIENT_CLINIC_OR_DEPARTMENT_OTHER): Payer: Self-pay | Admitting: Family Medicine

## 2023-10-02 ENCOUNTER — Ambulatory Visit (HOSPITAL_BASED_OUTPATIENT_CLINIC_OR_DEPARTMENT_OTHER)
Admission: EM | Admit: 2023-10-02 | Discharge: 2023-10-02 | Disposition: A | Discharge status: Home / Self Care | Attending: Family Medicine | Admitting: Family Medicine

## 2023-10-02 DIAGNOSIS — J0140 Acute pansinusitis, unspecified: Secondary | ICD-10-CM | POA: Diagnosis not present

## 2023-10-02 DIAGNOSIS — H6121 Impacted cerumen, right ear: Secondary | ICD-10-CM

## 2023-10-02 DIAGNOSIS — H9202 Otalgia, left ear: Secondary | ICD-10-CM | POA: Diagnosis not present

## 2023-10-02 MED ORDER — FLUTICASONE PROPIONATE 50 MCG/ACT NA SUSP: 1.0000 | Freq: Two times a day (BID) | NASAL | 0 refills | Status: DC | PRN

## 2023-10-02 MED ORDER — DOXYCYCLINE HYCLATE 100 MG PO CAPS: 100.0000 mg | ORAL_CAPSULE | Freq: Two times a day (BID) | ORAL | 0 refills | Status: AC

## 2023-10-02 NOTE — Discharge Instructions (Addendum)
 Acute sinusitis with ear pain: Ear exam is normal.  Doxycycline 100 mg twice daily for sinusitis.  Fluticasone nasal spray, 1 spray into each nostril once or twice daily for nasal congestion.  Encouraged sinus rinses with saline bottle kit twice daily.  Follow-up if symptoms do not improve, worsen or new symptoms occur

## 2023-10-02 NOTE — ED Triage Notes (Addendum)
 Pt c/o left ear pain for over 1 week  st's she has finished Rx for amoxicillin  but st's ear is still hurting. Also st's she has had a productive cough as well

## 2023-10-02 NOTE — ED Provider Notes (Signed)
 [facility]    CSN: [id number] Arrival date & time: 10/02/23  1909      History   Chief Complaint Chief Complaint  Patient presents with   Otalgia    HPI Connie Cummings is a 63 y.o. female.   Patient reports acute left ear pain and nasal congestion that started approximately 09/21/2023.  She did telehealth visit and was sent in amoxicillin  twice daily for 7 days.  She completed the antibiotic and started having ear pain within 2 days.  She is concerned that she has an unresolved left ear infection.  She also has a lot of nasal congestion and cough.  She denies any wheezing, fever, nausea, vomiting, constipation, diarrhea   Otalgia Associated symptoms: congestion, cough and rhinorrhea   Associated symptoms: no abdominal pain, no diarrhea, no fever, no rash, no sore throat and no vomiting     Past Medical History:  Diagnosis Date   Cancer ([facility])    melanoma   Hypertension    Kyphosis    Scoliosis    Thyroid  nodule    Per patient    Patient Active Problem List   Diagnosis Date Noted   Herpes simplex 04/06/2020   Bilateral hand pain 03/18/2020   Positive ANA (antinuclear antibody) 03/18/2020   Petechial rash 03/18/2020   Essential hypertension 03/18/2020   Thyroid  nodule 12/28/2015   Other idiopathic scoliosis, lumbar region 07/22/2014    Past Surgical History:  Procedure Laterality Date   APPENDECTOMY     BACK SURGERY  2016   scoliosis and kyphosis so rods and screws in back   CESAREAN SECTION     COLONOSCOPY  01/19/2013   Small internal hemorrhoids. Otherwise normal colonoscopy to terminal ileum    TONSILLECTOMY      OB History   No obstetric history on file.      Home Medications    Prior to Admission medications   Medication Sig Start Date End Date Taking? Authorizing Provider  doxycycline (VIBRAMYCIN) 100 MG capsule Take 1 capsule (100 mg total) by mouth 2 (two) times daily for 10 days. 10/02/23 10/12/23 Yes Ival Domino, FNP  fluticasone  (FLONASE) 50 MCG/ACT nasal spray Place 1 spray into both nostrils 2 (two) times daily as needed for rhinitis. 10/02/23 11/01/23 Yes Ival Domino, FNP  5-Hydroxytryptophan (5-HTP PO) Take 3 tablets by mouth daily.    [provider]  acyclovir (ZOVIRAX) 400 MG tablet Take 400 mg by mouth as needed. 03/28/20   [provider]  AMBULATORY NON FORMULARY MEDICATION 2 tablets daily. Estropause    [provider]  AMBULATORY NON FORMULARY MEDICATION 1 tablet daily. Liver Refresh    [provider]  b complex vitamins capsule Take 1 capsule by mouth daily.    [provider]  Barberry-Oreg Grape-Goldenseal (BERBERINE COMPLEX PO) Take by mouth.    [provider]  Boswellia Serrata (BOSWELLIA PO) Take by mouth.    [provider]  CALCIUM PO Take 1 tablet by mouth daily.    [provider]  calcium-vitamin D (OSCAL WITH D) 500-200 MG-UNIT TABS tablet Take by mouth.    [provider]  GLUCOSAMINE-CHONDROIT-MSM-C-MN PO Take 1 tablet by mouth daily.    [provider]  hydroxychloroquine  (PLAQUENIL ) 200 MG tablet Take 1 tablet (200 mg total) by mouth daily. 12/05/21   Jeannetta Lonni ORN, MD  L-LYSINE PO Take 1 tablet by mouth daily.    [provider]  Magnesium Citrate 100 MG TABS  [provider]  MAGNESIUM PO Take 1 tablet by mouth daily.    [provider]  Multiple Vitamin (MULTI-VITAMIN) tablet Take 1 tablet by mouth daily.    [provider]  Multiple Vitamins-Minerals (ZINC PO) Take 1-2 tablets by mouth daily.    [provider]  olmesartan-hydrochlorothiazide (BENICAR HCT) 40-25 MG tablet Take 1 tablet by mouth daily. 06/04/21   [provider]  Omega 3 1000 MG CAPS     [provider]  Probiotic Product (PROBIOTIC PO) Take 1 tablet by mouth daily. Brand Name Mega Sporebiotic    [provider]  Vitamin D-Vitamin K (VITAMIN K2-VITAMIN D3 PO)  Take 1 tablet by mouth daily.    [provider]    Family History Family History  Problem Relation Age of Onset   Thyroid  disease Mother    Colon polyps Sister    Thyroid  disease Sister    Heart disease Father    Heart disease Paternal Grandmother    Diabetes Paternal Uncle    Diabetes Paternal Aunt    Heart disease Paternal Aunt    Stroke Daughter    Celiac disease Daughter    Healthy Daughter    Thyroid  disease Daughter    Healthy Son     Social History Social History   Tobacco Use   Smoking status: Never    Passive exposure: Never   Smokeless tobacco: Never  Vaping Use   Vaping status: Never Used  Substance Use Topics   Alcohol use: Yes    Comment: Socially   Drug use: Never     Allergies   Oxycodone, Oxycontin [oxycodone hcl], and Tramadol   Review of Systems Review of Systems  Constitutional:  Negative for chills and fever.  HENT:  Positive for congestion, ear pain, postnasal drip, rhinorrhea, sinus pressure and sinus pain. Negative for sore throat.   Eyes:  Negative for pain and visual disturbance.  Respiratory:  Positive for cough. Negative for shortness of breath.   Cardiovascular:  Negative for chest pain and palpitations.  Gastrointestinal:  Negative for abdominal pain, constipation, diarrhea, nausea and vomiting.  Genitourinary:  Negative for dysuria and hematuria.  Musculoskeletal:  Negative for arthralgias and back pain.  Skin:  Negative for color change and rash.  Neurological:  Negative for seizures and syncope.  All other systems reviewed and are negative.    Physical Exam Triage Vital Signs ED Triage Vitals  Encounter Vitals Group     BP 10/02/23 1917 (!) 161/97     Girls Systolic BP Percentile --      Girls Diastolic BP Percentile --      Boys Systolic BP Percentile --      Boys Diastolic BP Percentile --      Pulse Rate 10/02/23 1917 77     Resp 10/02/23 1917 18     Temp 10/02/23 1917 98.4 F (36.9 C)     Temp src --       SpO2 10/02/23 1917 95 %     Weight --      Height --      Head Circumference --      Peak Flow --      Pain Score 10/02/23 1918 4     Pain Loc --      Pain Education --      Exclude from Growth Chart --    No data found.  Updated Vital Signs BP (!) 161/97 (BP Location: Right Arm)   Pulse 77  Temp 98.4 F (36.9 C)   Resp 18   SpO2 95%   Visual Acuity Right Eye Distance:   Left Eye Distance:   Bilateral Distance:    Right Eye Near:   Left Eye Near:    Bilateral Near:     Physical Exam Vitals and nursing note reviewed.  Constitutional:      General: She is not in acute distress.    Appearance: She is well-developed. She is not ill-appearing or toxic-appearing.  HENT:     Head: Normocephalic and atraumatic.     Right Ear: Hearing, tympanic membrane, ear canal and external ear normal.     Left Ear: Hearing, tympanic membrane, ear canal and external ear normal.     Nose: Congestion and rhinorrhea present. Rhinorrhea is clear.     Right Sinus: Maxillary sinus tenderness and frontal sinus tenderness present.     Left Sinus: Maxillary sinus tenderness and frontal sinus tenderness present.     Mouth/Throat:     Lips: Pink.     Mouth: Mucous membranes are moist.     Pharynx: Uvula midline. No oropharyngeal exudate or posterior oropharyngeal erythema.     Tonsils: No tonsillar exudate.  Eyes:     Conjunctiva/sclera: Conjunctivae normal.     Pupils: Pupils are equal, round, and reactive to light.  Cardiovascular:     Rate and Rhythm: Normal rate and regular rhythm.     Heart sounds: S1 normal and S2 normal. No murmur heard. Pulmonary:     Effort: Pulmonary effort is normal. No respiratory distress.     Breath sounds: Normal breath sounds. No decreased breath sounds, wheezing, rhonchi or rales.  Abdominal:     General: Bowel sounds are normal.     Palpations: Abdomen is soft.     Tenderness: There is no abdominal tenderness.  Musculoskeletal:        General: No  swelling.     Cervical back: Neck supple.  Lymphadenopathy:     Head:     Right side of head: No submental, submandibular, tonsillar, preauricular or posterior auricular adenopathy.     Left side of head: No submental, submandibular, tonsillar, preauricular or posterior auricular adenopathy.     Cervical: No cervical adenopathy.     Right cervical: No superficial cervical adenopathy.    Left cervical: No superficial cervical adenopathy.  Skin:    General: Skin is warm and dry.     Capillary Refill: Capillary refill takes less than 2 seconds.     Findings: No rash.  Neurological:     Mental Status: She is alert and oriented to person, place, and time.  Psychiatric:        Mood and Affect: Mood normal.      UC Treatments / Results  Labs (all labs ordered are listed, but only abnormal results are displayed) Labs Reviewed - No data to display  EKG   Radiology No results found.  Procedures Procedures (including critical care time)  Medications Ordered in UC Medications - No data to display  Initial Impression / Assessment and Plan / UC Course  I have reviewed the triage vital signs and the nursing notes.  Pertinent labs & imaging results that were available during my care of the patient were reviewed by me and considered in my medical decision making (see chart for details).  Plan of Care: Sinusitis and ear pain: Exam is consistent with mild sinusitis.  Doxycycline 100 mg twice daily for 7 days.  Get plenty of  fluids and rest.  Encouraged sinus rinses.  Fluticasone nasal spray, 1 spray into each nostril once or twice daily for nasal congestion.  Right ear was blocked with wax.  Encouraged to return when she feels better for ear lavage.  Follow-up if symptoms do not improve, worsen or new symptoms occur.  I reviewed the plan of care with the patient and/or the patient's guardian.  The patient and/or guardian had time to ask questions and acknowledged that the questions  were answered.  I provided instruction on symptoms or reasons to return here or to go to an ER, if symptoms/condition did not improve, worsened or if new symptoms occurred.  Final Clinical Impressions(s) / UC Diagnoses   Final diagnoses:  Acute non-recurrent pansinusitis  Acute otalgia, left  Impacted cerumen of right ear     Discharge Instructions      Acute sinusitis with ear pain: Ear exam is normal.  Doxycycline 100 mg twice daily for sinusitis.  Fluticasone nasal spray, 1 spray into each nostril once or twice daily for nasal congestion.  Encouraged sinus rinses with saline bottle kit twice daily.  Follow-up if symptoms do not improve, worsen or new symptoms occur     ED Prescriptions     Medication Sig Dispense Auth. Provider   fluticasone (FLONASE) 50 MCG/ACT nasal spray Place 1 spray into both nostrils 2 (two) times daily as needed for rhinitis. 17 mL Ival Domino, FNP   doxycycline (VIBRAMYCIN) 100 MG capsule Take 1 capsule (100 mg total) by mouth 2 (two) times daily for 10 days. 20 capsule Ival Domino, FNP      PDMP not reviewed this encounter.   Ival Domino, FNP 10/02/23 1945

## 2023-10-25 DIAGNOSIS — L820 Inflamed seborrheic keratosis: Secondary | ICD-10-CM | POA: Diagnosis not present

## 2023-10-25 DIAGNOSIS — L910 Hypertrophic scar: Secondary | ICD-10-CM | POA: Diagnosis not present

## 2023-10-25 DIAGNOSIS — Z85828 Personal history of other malignant neoplasm of skin: Secondary | ICD-10-CM | POA: Diagnosis not present

## 2023-11-28 ENCOUNTER — Encounter: Payer: Self-pay | Admitting: Allergy and Immunology

## 2023-11-28 ENCOUNTER — Other Ambulatory Visit: Payer: Self-pay | Admitting: Endocrinology

## 2023-11-28 ENCOUNTER — Ambulatory Visit: Admitting: Allergy and Immunology

## 2023-11-28 VITALS — BP 130/82 | HR 88 | Resp 16 | Ht 66.0 in | Wt 211.2 lb

## 2023-11-28 DIAGNOSIS — L719 Rosacea, unspecified: Secondary | ICD-10-CM | POA: Diagnosis not present

## 2023-11-28 DIAGNOSIS — E041 Nontoxic single thyroid nodule: Secondary | ICD-10-CM

## 2023-11-28 DIAGNOSIS — R7989 Other specified abnormal findings of blood chemistry: Secondary | ICD-10-CM | POA: Diagnosis not present

## 2023-11-28 DIAGNOSIS — L299 Pruritus, unspecified: Secondary | ICD-10-CM | POA: Diagnosis not present

## 2023-11-28 DIAGNOSIS — R768 Other specified abnormal immunological findings in serum: Secondary | ICD-10-CM | POA: Diagnosis not present

## 2023-11-28 MED ORDER — FEXOFENADINE HCL 180 MG PO TABS: ORAL_TABLET | ORAL | 5 refills | Status: AC

## 2023-11-28 MED ORDER — METRONIDAZOLE 0.75 % EX CREA: TOPICAL_CREAM | Freq: Two times a day (BID) | CUTANEOUS | 5 refills | Status: DC

## 2023-11-28 MED ORDER — FAMOTIDINE 20 MG PO TABS: 20.0000 mg | ORAL_TABLET | Freq: Two times a day (BID) | ORAL | 5 refills | Status: DC

## 2023-11-28 NOTE — Progress Notes (Signed)
 [place] - [place] - [place] - [place] -    Dear Dr. Fernand,  Thank you for referring Connie Cummings to the [facility] [place]  on 11/28/2023.   Below is a summation of this patient's evaluation and recommendations.  Thank you for your referral. I will keep you informed about this patient's response to treatment.   If you have any questions please do not hesitate to contact me.   Sincerely,  Camellia DOROTHA Denis, MD Allergy / Immunology [facility] [place]    ______________________________________________________________________    NEW PATIENT NOTE  Referring Provider: Fernand Tracey LABOR, MD Primary Provider: Fernand Tracey LABOR, MD Date of office visit: 11/28/2023    Subjective:   Chief Complaint:  Connie Cummings (DOB: 1960/12/11) is a 63 y.o. female who presents to the clinic on 11/28/2023 with a chief complaint of Rash and Pruritis .     HPI: Connie Cummings presents is to this clinic in evaluation of a pruritic disorder.  Connie Cummings states that for the past 5 years she has been having itching across her body.  Sometimes there are spots that are itchy and sometimes she has global pruritus involving every part of her body including her scalp.  When she has these episodes she states that her joints actually hurt.  But she has no other associated systemic or constitutional symptoms.  She never develops any lesions on her skin and she never has any scarring or hyperpigmentation.  She apparently has a positive ANA and is seeing rheumatology several years ago and was placed on hydroxychloroquine  which really did not help her chronic joint pain.  So she discontinued this agent and has not had any follow-up.  She informs me that she has fatty liver.  She informs me that she uses daily acyclovir for herpes keratitis.  She does not really have any other associated atopic disease.  She did have some early childhood eczema  that resolved before she had memory od this event.  She has attempted to use cetirizine but it makes her so drowsy she cannot tolerate this agent.  Past Medical History:  Diagnosis Date   Cancer (HCC)    melanoma   Hypertension    Kyphosis    Scoliosis    Thyroid  nodule    Per patient    Past Surgical History:  Procedure Laterality Date   APPENDECTOMY     BACK SURGERY  2016   scoliosis and kyphosis so rods and screws in back   CESAREAN SECTION     COLONOSCOPY  01/19/2013   Small internal hemorrhoids. Otherwise normal colonoscopy to terminal ileum    TONSILLECTOMY      Allergies as of 11/28/2023       Reactions   Oxycodone Shortness Of Breath   Mobic [meloxicam] Itching   Oxycontin [oxycodone Hcl]    Tramadol Rash        Medication List     5-HTP PO Take 3 tablets by mouth daily.   acyclovir 400 MG tablet Commonly known as: ZOVIRAX Take 400 mg by mouth as needed.   AMBULATORY NON FORMULARY MEDICATION 2 tablets daily. Estropause   AMBULATORY NON FORMULARY MEDICATION 1 tablet daily. Liver Refresh   b complex vitamins capsule Take 1 capsule by mouth daily.   BERBERINE COMPLEX PO Take by mouth.   BOSWELLIA PO Take by mouth.   CALCIUM PO Take 1 tablet by mouth daily.   calcium-vitamin D 500-200 MG-UNIT  Tabs tablet Commonly known as: OSCAL WITH D Take by mouth.   GLUCOSAMINE-CHONDROIT-MSM-C-MN PO Take 1 tablet by mouth daily.   L-LYSINE PO Take 1 tablet by mouth daily.   Magnesium Citrate 100 MG Tabs   MAGNESIUM PO Take 1 tablet by mouth daily.   Multi-Vitamin tablet Take 1 tablet by mouth daily.   olmesartan-hydrochlorothiazide 40-25 MG tablet Commonly known as: BENICAR HCT Take 1 tablet by mouth daily.   Omega 3 1000 MG Caps   PROBIOTIC PO Take 1 tablet by mouth daily. Brand Name Mega Sporebiotic   valACYclovir 500 MG tablet Commonly known as: VALTREX Take 500 mg by mouth daily.   VITAMIN K2-VITAMIN D3 PO Take 1 tablet by  mouth daily.   ZINC PO Take 1-2 tablets by mouth daily.    Review of systems negative except as noted in HPI / PMHx or noted below:  Review of Systems  Constitutional: Negative.   HENT: Negative.    Eyes: Negative.   Respiratory: Negative.    Cardiovascular: Negative.   Gastrointestinal: Negative.   Genitourinary: Negative.   Musculoskeletal: Negative.   Skin: Negative.   Neurological: Negative.   Endo/Heme/Allergies: Negative.   Psychiatric/Behavioral: Negative.      Family History  Problem Relation Age of Onset   Thyroid  disease Mother    Colon polyps Sister    Thyroid  disease Sister    Heart disease Father    Heart disease Paternal Grandmother    Diabetes Paternal Uncle    Diabetes Paternal Aunt    Heart disease Paternal Aunt    Stroke Daughter    Celiac disease Daughter    Healthy Daughter    Thyroid  disease Daughter    Healthy Son     Social History   Socioeconomic History   Marital status: Married    Spouse name: Not on file   Number of children: Not on file   Years of education: Not on file   Highest education level: Not on file  Occupational History   Not on file  Tobacco Use   Smoking status: Never    Passive exposure: Never   Smokeless tobacco: Never  Vaping Use   Vaping status: Never Used  Substance and Sexual Activity   Alcohol use: Yes    Comment: Socially   Drug use: Never   Sexual activity: Not on file  Other Topics Concern   Not on file  Social History Narrative   Not on file   Objective:   Vitals:   11/28/23 0948  BP: 130/82  Pulse: 88  Resp: 16  SpO2: 98%   Height: 5' 6 (167.6 cm) Weight: 211 lb 3.2 oz (95.8 kg)  Physical Exam Constitutional:      Appearance: She is not diaphoretic.  HENT:     Head: Normocephalic.     Right Ear: Tympanic membrane, ear canal and external ear normal.     Left Ear: Tympanic membrane, ear canal and external ear normal.     Nose: Nose normal. No mucosal edema or rhinorrhea.      Mouth/Throat:     Pharynx: Uvula midline. No oropharyngeal exudate.  Eyes:     Conjunctiva/sclera: Conjunctivae normal.  Neck:     Thyroid : No thyromegaly.     Trachea: Trachea normal. No tracheal tenderness or tracheal deviation.  Cardiovascular:     Rate and Rhythm: Normal rate and regular rhythm.     Heart sounds: Normal heart sounds, S1 normal and S2 normal. No murmur heard. Pulmonary:  Effort: No respiratory distress.     Breath sounds: Normal breath sounds. No stridor. No wheezing or rales.  Lymphadenopathy:     Head:     Right side of head: No tonsillar adenopathy.     Left side of head: No tonsillar adenopathy.     Cervical: No cervical adenopathy.  Skin:    Findings: Rash (Diffuse facial erythema with telangiectasia) present. No erythema.     Nails: There is no clubbing.  Neurological:     Mental Status: She is alert.     Diagnostics: Allergy skin tests were not performed.  Results of blood tests obtained 29 January 2020 identifies ANA positive, double-stranded ANA 100 6U/mL, negative RF, negative CCP, sed rate 16.  Assessment and Plan:    1. Pruritic disorder   2. Elevated antinuclear antibody (ANA) level   3. Ds DNA antibody positive   4. Elevated liver function tests   5. Rosacea     1. Blood - CBC w/d, CMP, anti-mitochondrial antibody, ANA w/ENA, DS DNA, thyroid  peroxidase ab, TSH, FT4  2. Minimize use of OTC supplements  3. Raise itch threshold with the following:   A. Fexofenadine  180 - 1-2 tablets 2 times per day (MAX=4/day)  B. Famotidine  20 mg - 1 tablet 2 times per day  4. Treat inflammation of skin with the following:   A. Metrocream  - apply 2 times per day  5. Return to clinic in 4 weeks. Further evaluation?  6. Plan for fall flu vaccine  7. Influenza = Tamiflu. Covid = Paxlovid  Connie Cummings has some form of immune activation that may be responsible for her pruritic disorder, her positive double-stranded DNA antibody, and elevated liver  function tests.  We will have her use a combination of an H1 and H2 receptor blocker and have her undergo further evaluation for her pruritus and laboratory abnormalities as noted above.  She also appears to have some rosacea for which she can use MetroCream .  I will see her back in this clinic in 4 weeks to evaluate her response to this approach and the results of her diagnostic testing.  Camellia DOROTHA Denis, MD Allergy / Immunology [facility] [place] 

## 2023-11-28 NOTE — Patient Instructions (Addendum)
  1. Blood - CBC w/d, CMP, anti-mitochondrial antibody, ANA w/ENA, DS DNA, thyroid  peroxidase ab, TSH, FT4  2. Minimize use of OTC supplements  3. Raise itch threshold with the following:   A. Fexofenadine  180 - 1-2 tablets 2 times per day (MAX=4/day)  B. Famotidine  20 mg - 1 tablet 2 times per day  4. Treat inflammation of skin with the following:   A. Metrocream  - apply 2 times per day  5. Return to clinic in 4 weeks. Further evaluation?  6. Plan for fall flu vaccine  7. Influenza = Tamiflu. Covid = Paxlovid

## 2023-12-03 ENCOUNTER — Encounter: Payer: Self-pay | Admitting: Allergy and Immunology

## 2023-12-04 ENCOUNTER — Other Ambulatory Visit: Payer: Self-pay | Admitting: Endocrinology

## 2023-12-04 DIAGNOSIS — E041 Nontoxic single thyroid nodule: Secondary | ICD-10-CM

## 2023-12-08 LAB — CBC WITH DIFF/PLATELET
Basophils Absolute: 0 x10E3/uL (ref 0.0–0.2)
Basos: 0 %
EOS (ABSOLUTE): 0.1 x10E3/uL (ref 0.0–0.4)
Eos: 1 %
Hematocrit: 42.5 % (ref 34.0–46.6)
Hemoglobin: 14.1 g/dL (ref 11.1–15.9)
Immature Grans (Abs): 0 x10E3/uL (ref 0.0–0.1)
Immature Granulocytes: 0 %
Lymphocytes Absolute: 2.7 x10E3/uL (ref 0.7–3.1)
Lymphs: 39 %
MCH: 30.7 pg (ref 26.6–33.0)
MCHC: 33.2 g/dL (ref 31.5–35.7)
MCV: 93 fL (ref 79–97)
Monocytes Absolute: 0.5 x10E3/uL (ref 0.1–0.9)
Monocytes: 7 %
Neutrophils Absolute: 3.6 x10E3/uL (ref 1.4–7.0)
Neutrophils: 53 %
Platelets: 287 x10E3/uL (ref 150–450)
RBC: 4.59 x10E6/uL (ref 3.77–5.28)
RDW: 13.1 % (ref 11.7–15.4)
WBC: 6.9 x10E3/uL (ref 3.4–10.8)

## 2023-12-08 LAB — ANA 12 PROFILE, DO ALL RDL
Anti-Cardiolipin Ab, IgA (RDL): <12 U/mL (ref <12)
Anti-Cardiolipin Ab, IgG (RDL): <15 GPL U/mL (ref <15)
Anti-Cardiolipin Ab, IgM (RDL): <13 [MPL'U]/mL (ref <13)
Anti-Centromere Ab (RDL): <1:40 {titer}
Anti-La (SS-B) Ab (RDL): <20 U (ref <20)
Anti-Nuclear Ab by IFA (RDL): POSITIVE — AB
Anti-Ro (SS-A) Ab (RDL): <20 U (ref <20)
Anti-Scl-70 Ab (RDL): <20 U (ref <20)
Anti-Sm Ab (RDL): <20 U (ref <20)
Anti-U1 RNP Ab (RDL): <20 U (ref <20)
Anti-dsDNA Ab by Farr(RDL): <8 [IU]/mL (ref <8.0)
C3 Complement (RDL): 193 mg/dL — ABNORMAL HIGH (ref 90–180)
C4 Complement (RDL): 24 mg/dL (ref 10–40)

## 2023-12-08 LAB — COMPREHENSIVE METABOLIC PANEL WITH GFR
ALT: 81 IU/L — ABNORMAL HIGH (ref 0–32)
Albumin: 4.5 g/dL (ref 3.9–4.9)
Alkaline Phosphatase: 58 IU/L (ref 44–121)
BUN/Creatinine Ratio: 17 (ref 12–28)
BUN: 15 mg/dL (ref 8–27)
Bilirubin Total: 0.3 mg/dL (ref 0.0–1.2)
CO2: 18 mmol/L — ABNORMAL LOW (ref 20–29)
Calcium: 9.8 mg/dL (ref 8.7–10.3)
Chloride: 99 mmol/L (ref 96–106)
Creatinine, Ser: 0.9 mg/dL (ref 0.57–1.00)
Globulin, Total: 3.3 g/dL (ref 1.5–4.5)
Glucose: 124 mg/dL — ABNORMAL HIGH (ref 70–99)
Sodium: 135 mmol/L (ref 134–144)
Total Protein: 7.8 g/dL (ref 6.0–8.5)
eGFR: 73 mL/min/1.73 (ref >59)

## 2023-12-08 LAB — ANA TITER AND PATTERN
Homogeneous Pattern: 1:160 {titer} — ABNORMAL HIGH
Speckled Pattern: 1:160 {titer} — ABNORMAL HIGH

## 2023-12-08 LAB — TSH+FREE T4
Free T4: 1.05 ng/dL (ref 0.82–1.77)
TSH: 1.36 u[IU]/mL (ref 0.450–4.500)

## 2023-12-08 LAB — MITOCHONDRIAL ANTIBODIES: Mitochondrial Ab: <20 U (ref 0.0–20.0)

## 2023-12-08 LAB — THYROID PEROXIDASE ANTIBODY: Thyroperoxidase Ab SerPl-aCnc: 14 [IU]/mL (ref 0–34)

## 2023-12-09 ENCOUNTER — Ambulatory Visit: Payer: Self-pay | Admitting: Allergy and Immunology

## 2023-12-10 ENCOUNTER — Encounter: Payer: Self-pay | Admitting: Gastroenterology

## 2023-12-15 ENCOUNTER — Ambulatory Visit (HOSPITAL_BASED_OUTPATIENT_CLINIC_OR_DEPARTMENT_OTHER): Admit: 2023-12-15 | Discharge: 2023-12-15 | Disposition: A | Discharge status: Home / Self Care | Admitting: Radiology

## 2023-12-15 ENCOUNTER — Ambulatory Visit (HOSPITAL_BASED_OUTPATIENT_CLINIC_OR_DEPARTMENT_OTHER)
Admission: EM | Admit: 2023-12-15 | Discharge: 2023-12-15 | Disposition: A | Discharge status: Home / Self Care | Attending: Family Medicine | Admitting: Family Medicine

## 2023-12-15 ENCOUNTER — Encounter (HOSPITAL_BASED_OUTPATIENT_CLINIC_OR_DEPARTMENT_OTHER): Payer: Self-pay

## 2023-12-15 DIAGNOSIS — J069 Acute upper respiratory infection, unspecified: Secondary | ICD-10-CM

## 2023-12-15 DIAGNOSIS — R519 Headache, unspecified: Secondary | ICD-10-CM | POA: Diagnosis not present

## 2023-12-15 DIAGNOSIS — R058 Other specified cough: Secondary | ICD-10-CM | POA: Diagnosis not present

## 2023-12-15 DIAGNOSIS — Z981 Arthrodesis status: Secondary | ICD-10-CM | POA: Diagnosis not present

## 2023-12-15 DIAGNOSIS — R0989 Other specified symptoms and signs involving the circulatory and respiratory systems: Secondary | ICD-10-CM | POA: Diagnosis not present

## 2023-12-15 LAB — POC COVID19/FLU A&B COMBO
Covid Antigen, POC: NEGATIVE
Influenza A Antigen, POC: NEGATIVE
Influenza B Antigen, POC: NEGATIVE

## 2023-12-15 NOTE — Discharge Instructions (Signed)
 This is most likely a viral illness.  Recommend taking over-the-counter medications to include Mucinex, Tylenol  and Motrin for your symptoms.  Rest and hydrate.  Your COVID, flu test was negative and chest x-ray was normal. Follow-up as needed

## 2023-12-15 NOTE — ED Provider Notes (Signed)
 [facility]    CSN: [id number] Arrival date & time: 12/15/23  0847      History   Chief Complaint Chief Complaint  Patient presents with   Cough   Chest congestion    HPI Connie Cummings is a 63 y.o. female.   Patient is a 63 year old female who presents today with onset 3 days ago of headache, cough, sinus congestion. Cough is productive, thick secretions. 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  Denies any fever, chills or bodyaches.  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 Otherwise normal colonoscopy to terminal ileum    TONSILLECTOMY      OB History   No obstetric history on file.      Home Medications    Prior to Admission medications   Medication Sig Start Date End Date Taking? Authorizing Provider  5-Hydroxytryptophan (5-HTP PO) Take 3 tablets by mouth daily.    [provider]  acyclovir (ZOVIRAX) 400 MG tablet Take 400 mg by mouth as needed. 03/28/20   [provider]  AMBULATORY NON FORMULARY MEDICATION 2 tablets daily. Estropause    [provider]  AMBULATORY NON FORMULARY MEDICATION 1 tablet daily. Liver Refresh    [provider]  b complex vitamins capsule Take 1 capsule by mouth daily.    [provider]  Barberry-Oreg Grape-Goldenseal (BERBERINE COMPLEX PO) Take by mouth.    [provider]  Boswellia Serrata (BOSWELLIA PO) Take by mouth.    [provider]  CALCIUM PO Take 1 tablet by mouth daily.    [provider]  calcium-vitamin D (OSCAL WITH D) 500-200 MG-UNIT TABS tablet Take by mouth.    [provider]  famotidine  (PEPCID ) 20 MG tablet Take 1 tablet (20 mg total) by mouth 2 (two) times daily. 11/28/23   Kozlow, Camellia PARAS, MD  fexofenadine  (ALLEGRA ) 180 MG tablet 1-2 tablets 2 times daily 11/28/23   Kozlow, Eric J, MD  GLUCOSAMINE-CHONDROIT-MSM-C-MN PO Take 1 tablet by mouth daily.    [provider]  L-LYSINE PO Take 1 tablet by mouth daily.    [provider]  Magnesium Citrate 100 MG TABS     [provider]  MAGNESIUM PO Take 1 tablet by mouth daily.    [provider]  metroNIDAZOLE  (METROCREAM ) 0.75 % cream Apply topically 2 (two) times daily. 11/28/23   Kozlow, Camellia PARAS, MD  Multiple Vitamin (MULTI-VITAMIN) tablet Take 1 tablet by mouth daily.    [provider]  Multiple Vitamins-Minerals (ZINC PO) Take 1-2 tablets by mouth daily.    [provider]  olmesartan-hydrochlorothiazide (BENICAR HCT) 40-25 MG tablet Take 1 tablet by mouth daily.  06/04/21   [provider]  Omega 3 1000 MG CAPS     [provider]  Probiotic Product (PROBIOTIC PO) Take 1 tablet by mouth daily. Brand Name Mega Sporebiotic    [provider]  valACYclovir (VALTREX) 500 MG tablet Take 500 mg by mouth daily.    [provider]  Vitamin D-Vitamin K (VITAMIN K2-VITAMIN D3 PO) Take 1 tablet by mouth daily.    [provider]    Family History Family History  Problem Relation Age of Onset   Thyroid  disease Mother    Colon polyps Sister    Thyroid  disease Sister    Heart disease Father    Heart disease Paternal Grandmother    Diabetes Paternal Uncle    Diabetes Paternal Aunt     Heart disease Paternal Aunt    Stroke Daughter    Celiac disease Daughter    Healthy Daughter    Thyroid  disease Daughter    Healthy Son     Social History Social History   Tobacco Use   Smoking status: Never    Passive exposure: Never   Smokeless tobacco: Never  Vaping Use   Vaping status: Never Used  Substance Use Topics   Alcohol use: Yes    Comment: Socially   Drug use: Never     Allergies   Oxycodone, Mobic [meloxicam], Oxycontin [oxycodone hcl], and Tramadol   Review of Systems Review of Systems  Respiratory:  Positive for cough.      Physical Exam Triage Vital Signs ED Triage Vitals [12/15/23 0947]  Encounter Vitals Group     BP 124/82     Girls Systolic BP Percentile      Girls Diastolic BP Percentile      Boys Systolic BP Percentile      Boys Diastolic BP Percentile      Pulse Rate 68     Resp 20     Temp 98.3 F (36.8 C)     Temp Source Oral     SpO2 94 %     Weight      Height      Head Circumference      Peak Flow      Pain Score 4     Pain Loc      Pain Education      Exclude from Growth Chart    No data found.  Updated Vital Signs BP 124/82 (BP Location: Right Arm)   Pulse 68   Temp 98.3 F (36.8 C) (Oral)   Resp 20   SpO2 94%   Visual Acuity Right Eye Distance:   Left Eye Distance:   Bilateral Distance:    Right Eye Near:   Left Eye Near:    Bilateral Near:     Physical Exam Constitutional:      General: She is not in acute distress.    Appearance: Normal appearance. She is not ill-appearing, toxic-appearing or diaphoretic.  HENT:     Head: Normocephalic and atraumatic.     Right Ear: Tympanic membrane and ear canal normal.     Left Ear: Tympanic membrane and ear canal normal.     Nose: Congestion and rhinorrhea present.     Mouth/Throat:     Pharynx: Oropharynx is clear.  Eyes:     Conjunctiva/sclera: Conjunctivae normal.  Cardiovascular:     Rate and Rhythm: Normal rate and regular rhythm.     Pulses:  Normal pulses.     Heart sounds: Normal heart  sounds.  Pulmonary:     Effort: Pulmonary effort is normal.     Breath sounds: Normal breath sounds.  Skin:    General: Skin is warm and dry.  Neurological:     Mental Status: She is alert.  Psychiatric:        Mood and Affect: Mood normal.      UC Treatments / Results  Labs (all labs ordered are listed, but only abnormal results are displayed) Labs Reviewed  POC COVID19/FLU A&B COMBO - Normal    EKG   Radiology No results found.  Procedures Procedures (including critical care time)  Medications Ordered in UC Medications - No data to display  Initial Impression / Assessment and Plan / UC Course  I have reviewed the triage vital signs and the nursing notes.  Pertinent labs & imaging results that were available during my care of the patient were reviewed by me and considered in my medical decision making (see chart for details).     Viral URI with cough-no concerns on exam.  Recommend over-the-counter symptomatic treatment as needed.  COVID, flu testing negative and chest x-ray normal. Follow-up as needed Final Clinical Impressions(s) / UC Diagnoses   Final diagnoses:  Viral URI with cough     Discharge Instructions      This is most likely a viral illness.  Recommend taking over-the-counter medications to include Mucinex, Tylenol  and Motrin for your symptoms.  Rest and hydrate.  Your COVID, flu test was negative and chest x-ray was normal. Follow-up as needed    ED Prescriptions   None    PDMP not reviewed this encounter.   Adah Wilbert LABOR, FNP 12/15/23 1012

## 2023-12-15 NOTE — ED Triage Notes (Signed)
 Onset 3 days ago of headache, cough, sinus congestion. Cough is productive, thick secretions. 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

## 2023-12-24 DIAGNOSIS — E042 Nontoxic multinodular goiter: Secondary | ICD-10-CM | POA: Diagnosis not present

## 2023-12-25 DIAGNOSIS — E042 Nontoxic multinodular goiter: Secondary | ICD-10-CM | POA: Diagnosis not present

## 2023-12-30 ENCOUNTER — Ambulatory Visit: Admitting: Allergy and Immunology

## 2023-12-30 VITALS — BP 128/78 | HR 84 | Resp 16

## 2023-12-30 DIAGNOSIS — L299 Pruritus, unspecified: Secondary | ICD-10-CM

## 2023-12-30 DIAGNOSIS — R768 Other specified abnormal immunological findings in serum: Secondary | ICD-10-CM

## 2023-12-30 DIAGNOSIS — R7989 Other specified abnormal findings of blood chemistry: Secondary | ICD-10-CM

## 2023-12-30 DIAGNOSIS — L719 Rosacea, unspecified: Secondary | ICD-10-CM

## 2023-12-30 NOTE — Patient Instructions (Addendum)
  1. Evaluation of elevated liver function tests:   A. Blood - hep A/B/C, anti-smooth muscle/actin, anti-LKM, ferritin  B. Complete abdominal ultrasound  2. Minimize use of OTC supplements  3. Raise itch threshold with the following:   A. Fexofenadine  180 - 1-2 tablets 2 times per day (MAX=4/day)  B. Famotidine  20 mg - 1 tablet 2 times per day  4. Try Head and Shoulder shampoo  5. If needed:  A. Metrocream  - apply to face 2 times per day  5. Return to clinic in 12 weeks or earlier if problem  6. GLP-1 agonist??? Patch testing???  7. Plan for fall flu vaccine  8. Influenza = Tamiflu. Covid = Paxlovid

## 2023-12-30 NOTE — Progress Notes (Signed)
  - [place] - [place] - [place] - [place]   Follow-up Note  Referring Provider: Fernand Tracey LABOR, MD Primary Provider: Fernand Tracey LABOR, MD Date of Office Visit: 12/30/2023  Subjective:   Connie Cummings (DOB: 1960/04/11) is a 63 y.o. female who [facility]/2023 in re-evaluation of the following:  HPI: Connie Cummings returns to this clinic in evaluation of pruritic disorder, rosacea, elevated liver function test, elevated ANA.  I last saw her in this clinic during her initial evaluation 28 November 2023.  She is doing a lot better with her pruritic disorder using her current plan and eliminating most of her supplements.  The only area that is itchy at this point time is her scalp.  She has also been doing better with her joints while using beet juice in the morning and tart cherry at evening and she has been using this treatment for 2 weeks and she is doing much better.  She did not use any MetroCream  on her face because her face is no longer itchy.  She tells me that she has been diagnosed with fatty liver but never had any evaluation for this issue.  There was an attempt to treat her with a GLP-1 agonist, Zepbound, and she actually did lose weight on that agent but her insurance denied coverage of it past July 2025.  Allergies as of 12/30/2023       Reactions   Oxycodone Shortness Of Breath   Mobic [meloxicam] Itching   Oxycontin [oxycodone Hcl]    Tramadol Rash        Medication List    5-HTP PO Take 3 tablets by mouth daily.   acyclovir 400 MG tablet Commonly known as: ZOVIRAX Take 400 mg by mouth as needed.   AMBULATORY NON FORMULARY MEDICATION 2 tablets daily. Estropause   AMBULATORY NON FORMULARY MEDICATION 1 tablet daily. Liver Refresh   b complex vitamins capsule Take 1 capsule by mouth daily.   BERBERINE COMPLEX PO Take by mouth.   BOSWELLIA PO Take by mouth.   CALCIUM PO Take 1 tablet by mouth daily.    calcium-vitamin D 500-200 MG-UNIT Tabs tablet Commonly known as: OSCAL WITH D Take by mouth.   famotidine  20 MG tablet Commonly known as: PEPCID  Take 1 tablet (20 mg total) by mouth 2 (two) times daily.   fexofenadine  180 MG tablet Commonly known as: ALLEGRA  1-2 tablets 2 times daily   GLUCOSAMINE-CHONDROIT-MSM-C-MN PO Take 1 tablet by mouth daily.   L-LYSINE PO Take 1 tablet by mouth daily.   Magnesium Citrate 100 MG Tabs   MAGNESIUM PO Take 1 tablet by mouth daily.   metroNIDAZOLE  0.75 % cream Commonly known as: MetroCream  Apply topically 2 (two) times daily.   Multi-Vitamin tablet Take 1 tablet by mouth daily.   olmesartan-hydrochlorothiazide 40-25 MG tablet Commonly known as: BENICAR HCT Take 1 tablet by mouth daily.   Omega 3 1000 MG Caps   PROBIOTIC PO Take 1 tablet by mouth daily. Brand Name Mega Sporebiotic   valACYclovir 500 MG tablet Commonly known as: VALTREX Take 500 mg by mouth daily.   VITAMIN K2-VITAMIN D3 PO Take 1 tablet by mouth daily.   ZINC PO Take 1-2 tablets by mouth daily.    Past Medical History:  Diagnosis Date   Cancer (HCC)    melanoma   Hypertension    Kyphosis    Scoliosis    Thyroid  nodule    Per patient  Past Surgical History:  Procedure Laterality Date   APPENDECTOMY     BACK SURGERY  2016   scoliosis and kyphosis so rods and screws in back   CESAREAN SECTION     COLONOSCOPY  01/19/2013   Small internal hemorrhoids. Otherwise normal colonoscopy to terminal ileum    TONSILLECTOMY      Review of systems negative except as noted in HPI / PMHx or noted below:  Review of Systems  Constitutional: Negative.   HENT: Negative.    Eyes: Negative.   Respiratory: Negative.    Cardiovascular: Negative.   Gastrointestinal: Negative.   Genitourinary: Negative.   Musculoskeletal: Negative.   Skin: Negative.   Neurological: Negative.   Endo/Heme/Allergies: Negative.   Psychiatric/Behavioral: Negative.        Objective:   There were no vitals filed for this visit.        Physical Exam Skin:    Findings: Rash (slightly indurated erythematouse facial skin) present.     Diagnostics:    Results of blood tests obtained 28 November 2023 identifies creatinine 0.90 mg/DL, ALT 18L/O, WBC 6.9, absolute eosinophil 100, abs lymphocyte 2700, hemoglobin 14.1, platelet 287, ANA positive with negative ENA, C3 193 MGs/DL, C4 24 mg/DL, negative antimitochondrial antibody, thyroid  peroxidase antibody 14U/mL, TSH 1.360 IU/mL, free T41.05 NG/DL,  Assessment and Plan:   1. Pruritic disorder   2. Elevated antinuclear antibody (ANA) level   3. Elevated liver function tests   4. Rosacea     1. Evaluation of elevated liver function tests:   A. Blood - hep A/B/C, anti-smooth muscle/actin, anti-LKM, ferritin  B. Complete abdominal ultrasound  2. Minimize use of OTC supplements  3. Raise itch threshold with the following:   A. Fexofenadine  180 - 1-2 tablets 2 times per day (MAX=4/day)  B. Famotidine  20 mg - 1 tablet 2 times per day  4. Try Head and Shoulder shampoo  5. If needed:  A. Metrocream  - apply to face 2 times per day  5. Return to clinic in 12 weeks or earlier if problem  6. GLP-1 agonist??? Patch testing???  7. Plan for fall flu vaccine  8. Influenza = Tamiflu. Covid = Paxlovid  Connie Cummings is doing better with her pruritic disorder.  She has elevated liver function tests and this may be secondary to fatty liver but she also has a positive ANA without a positive reflex to ENA and she may have autoimmune hepatitis and we need to further explore her elevated liver function test with a complete abdominal ultrasound and additional blood test looking for autoimmune hepatitis.  She still has a little bit of itchiness on her scalp and she can try head and shoulder shampoo.  She needs to remain away from her supplements as these agents may have been causing some of her pruritus.  There are a few  other things that we may need to attend to in the future pending the results of her diagnostic testing and how she does as she moves through the next several months.  We may need to get her another GLP-1 agonist if it does turn out that her issue with elevated liver function test is secondary to fatty liver.  As well, the indurated skin on her face may require further evaluation with patch testing.  Connie Denis, MD Allergy / Immunology [facility]

## 2023-12-31 DIAGNOSIS — R768 Other specified abnormal immunological findings in serum: Secondary | ICD-10-CM | POA: Diagnosis not present

## 2023-12-31 DIAGNOSIS — R7989 Other specified abnormal findings of blood chemistry: Secondary | ICD-10-CM | POA: Diagnosis not present

## 2023-12-31 DIAGNOSIS — L299 Pruritus, unspecified: Secondary | ICD-10-CM | POA: Diagnosis not present

## 2024-01-01 ENCOUNTER — Telehealth: Payer: Self-pay | Admitting: *Deleted

## 2024-01-01 NOTE — Telephone Encounter (Signed)
 Spoke with insurance and they do NOT require a prior auth for the complete abdominal ultrasound. Reference # [id number]. I scheduled this at the [facility] for Monday 10/6 at 8:45 and she needs to be fasting. Pt is aware of appt.

## 2024-01-03 LAB — HAV, HBV, HCV
HCV Ab: NONREACTIVE
Hep B Core Total Ab: NEGATIVE
Hep B Surface Ab, Qual: REACTIVE
Hepatitis B Surface Ag: NEGATIVE
hep A Total Ab: NEGATIVE

## 2024-01-03 LAB — HCV INTERPRETATION

## 2024-01-03 LAB — ANTI-MICROSOMAL ANTIBODY LIVER / KIDNEY: LKM1 Ab: 0.9 U (ref 0.0–20.0)

## 2024-01-03 LAB — FERRITIN: Ferritin: 84 ng/mL (ref 15–150)

## 2024-01-03 LAB — ANTI-SMOOTH MUSCLE ANTIBODY, IGG: Smooth Muscle Ab: 9 U (ref 0–19)

## 2024-01-06 ENCOUNTER — Encounter: Payer: Self-pay | Admitting: Allergy and Immunology

## 2024-01-06 ENCOUNTER — Ambulatory Visit (INDEPENDENT_AMBULATORY_CARE_PROVIDER_SITE_OTHER)
Admission: RE | Admit: 2024-01-06 | Discharge: 2024-01-06 | Disposition: A | Discharge status: Home / Self Care | Source: Ambulatory Visit | Attending: Allergy and Immunology | Admitting: Allergy and Immunology

## 2024-01-06 ENCOUNTER — Ambulatory Visit: Payer: Self-pay | Admitting: Allergy and Immunology

## 2024-01-06 DIAGNOSIS — K760 Fatty (change of) liver, not elsewhere classified: Secondary | ICD-10-CM | POA: Diagnosis not present

## 2024-01-06 DIAGNOSIS — R7989 Other specified abnormal findings of blood chemistry: Secondary | ICD-10-CM | POA: Diagnosis not present

## 2024-01-07 ENCOUNTER — Other Ambulatory Visit: Payer: Self-pay | Admitting: *Deleted

## 2024-01-07 DIAGNOSIS — N2889 Other specified disorders of kidney and ureter: Secondary | ICD-10-CM

## 2024-01-09 NOTE — Telephone Encounter (Signed)
 Connie Cummings is scheduled for MRI of kidneys with and without contrast on Monday October 13th at 8:45 at the MRI center [address] [name], [name] Phone: [phone] NPO for 4 hours before.  Pt is aware.  Pre Cert # [id number] Expires 07/06/24

## 2024-01-13 DIAGNOSIS — N281 Cyst of kidney, acquired: Secondary | ICD-10-CM | POA: Diagnosis not present

## 2024-01-13 DIAGNOSIS — K760 Fatty (change of) liver, not elsewhere classified: Secondary | ICD-10-CM | POA: Diagnosis not present

## 2024-01-13 DIAGNOSIS — N2889 Other specified disorders of kidney and ureter: Secondary | ICD-10-CM | POA: Diagnosis not present

## 2024-01-16 ENCOUNTER — Telehealth: Payer: Self-pay

## 2024-01-16 NOTE — Telephone Encounter (Signed)
 Patient would like to know what the next step is in regards to the fatty liver issue. She has not had any evaluation done previously. Please advice. Thanks

## 2024-01-17 ENCOUNTER — Encounter: Payer: Self-pay | Admitting: Allergy and Immunology

## 2024-01-28 ENCOUNTER — Ambulatory Visit (AMBULATORY_SURGERY_CENTER)

## 2024-01-28 ENCOUNTER — Telehealth: Payer: Self-pay | Admitting: Gastroenterology

## 2024-01-28 VITALS — Ht 66.0 in | Wt 200.0 lb

## 2024-01-28 DIAGNOSIS — Z860100 Personal history of colon polyps, unspecified: Secondary | ICD-10-CM

## 2024-01-28 MED ORDER — NA SULFATE-K SULFATE-MG SULF 17.5-3.13-1.6 GM/177ML PO SOLN: 1.0000 | Freq: Once | ORAL | 0 refills | Status: AC

## 2024-01-28 NOTE — Telephone Encounter (Signed)
 Dr. Charlanne please see attached message. Review and advise.   Thank you

## 2024-01-28 NOTE — Telephone Encounter (Signed)
 Inbound call from [place] with Dr. Arther office. She states they did labs on patient and ultrasound and she has a fatty liver. They wanted to touch base with us  because they noticed she is scheduled to have a colonoscopy on 11/10 at 8:30. Harlene wanted us  to have this info to make sure she is okay to proceed with procedure.

## 2024-01-28 NOTE — Progress Notes (Signed)
 Pre visit completed via video call; Patient verified name, DOB, and address; No egg or soy allergy known to patient;  No issues known to pt with past sedation with any surgeries or procedures; Patient denies ever being told they had issues or difficulty with intubation;  No FH of Malignant Hyperthermia; Pt is not on diet pills; Pt is not on home 02;  Pt is not on blood thinners;  Pt denies issues with constipation;  No A fib or A flutter; Have any cardiac testing pending--NO Insurance verified during PV appt--- [employer] Pt can ambulate without assistance;  Pt denies use of chewing tobacco Discussed diabetic/weight loss medication holds; Discussed NSAID holds; Checked BMI to be less than 50; Pt instructed to use [employer] or GoodRx for a price reduction on prep;  Patient's chart reviewed by Norleen Schillings CNRA prior to previsit and patient appropriate for the [facility];  Pre visit completed and red dot placed by patient's name on their procedure day (on provider's schedule);   Instructions sent to MyChart per patient request;

## 2024-01-28 NOTE — Telephone Encounter (Signed)
 Spoke with Connie Cummings from Dr. Ira office in regards to patient needing to be seen for fatty liver. Since she is an established patient already referral would not need to be done. She will send a message back to the provider/nurses and they will advice what the next step is. Patient does have a colonoscopy coming up and maybe that discussion can be brought up then.   I informed patient that I spoke to Dr. Ira office and they will reach out to her if anything needs to be done.

## 2024-01-29 ENCOUNTER — Encounter: Payer: Self-pay | Admitting: Gastroenterology

## 2024-01-29 NOTE — Telephone Encounter (Signed)
 OK to proceed for colonoscopy RG

## 2024-01-30 NOTE — Telephone Encounter (Signed)
 VM left making the patient aware

## 2024-02-10 ENCOUNTER — Encounter: Payer: Self-pay | Admitting: Gastroenterology

## 2024-02-10 ENCOUNTER — Ambulatory Visit (AMBULATORY_SURGERY_CENTER): Admitting: Gastroenterology

## 2024-02-10 VITALS — BP 131/60 | HR 77 | Temp 98.1°F | Resp 16 | Ht 66.0 in | Wt 200.0 lb

## 2024-02-10 DIAGNOSIS — D124 Benign neoplasm of descending colon: Secondary | ICD-10-CM | POA: Diagnosis not present

## 2024-02-10 DIAGNOSIS — Z83719 Family history of colon polyps, unspecified: Secondary | ICD-10-CM

## 2024-02-10 DIAGNOSIS — K635 Polyp of colon: Secondary | ICD-10-CM | POA: Diagnosis not present

## 2024-02-10 DIAGNOSIS — K5730 Diverticulosis of large intestine without perforation or abscess without bleeding: Secondary | ICD-10-CM

## 2024-02-10 DIAGNOSIS — K640 First degree hemorrhoids: Secondary | ICD-10-CM

## 2024-02-10 DIAGNOSIS — Z1211 Encounter for screening for malignant neoplasm of colon: Secondary | ICD-10-CM | POA: Diagnosis not present

## 2024-02-10 DIAGNOSIS — Z860100 Personal history of colon polyps, unspecified: Secondary | ICD-10-CM

## 2024-02-10 DIAGNOSIS — K760 Fatty (change of) liver, not elsewhere classified: Secondary | ICD-10-CM | POA: Diagnosis not present

## 2024-02-10 DIAGNOSIS — D122 Benign neoplasm of ascending colon: Secondary | ICD-10-CM | POA: Diagnosis not present

## 2024-02-10 DIAGNOSIS — I10 Essential (primary) hypertension: Secondary | ICD-10-CM | POA: Diagnosis not present

## 2024-02-10 DIAGNOSIS — D120 Benign neoplasm of cecum: Secondary | ICD-10-CM | POA: Diagnosis not present

## 2024-02-10 MED ORDER — SODIUM CHLORIDE 0.9 % IV SOLN: 500.0000 mL | INTRAVENOUS | Status: DC

## 2024-02-10 MED ORDER — ONDANSETRON 4 MG PO TBDP
4.0000 mg | ORAL_TABLET | Freq: Once | ORAL | Status: AC
  Administered 2024-02-10: 4 mg via ORAL

## 2024-02-10 NOTE — Progress Notes (Signed)
 Pt's states no medical or surgical changes since previsit or office visit.

## 2024-02-10 NOTE — Progress Notes (Signed)
 [facility] History and Physical   Primary Care Physician:  Fernand Tracey LABOR, MD   Reason for Procedure:   FH polyps-   Plan:    colon   The patient was provided an opportunity to ask questions and all were answered. The patient agreed with the plan.   HPI: Connie Cummings is a 63 y.o. female    Past Medical History:  Diagnosis Date   Cancer (HCC)    melanoma   Fatty liver    Hypertension    Kyphosis    Scoliosis    Thyroid  nodule    Per patient    Past Surgical History:  Procedure Laterality Date   APPENDECTOMY     BACK SURGERY  2016   scoliosis and kyphosis so rods and screws in back   CESAREAN SECTION     COLONOSCOPY  01/19/2013   Small internal hemorrhoids. Otherwise normal colonoscopy to terminal ileum    COLONOSCOPY  02/2019   RG-MAC-Clenpiq (good)-normal-5 yr recall   TONSILLECTOMY      Prior to Admission medications   Medication Sig Start Date End Date Taking? Authorizing Provider  5-Hydroxytryptophan (5-HTP PO) Take 3 tablets by mouth daily.    [provider]  famotidine  (PEPCID ) 20 MG tablet Take 1 tablet (20 mg total) by mouth 2 (two) times daily. Patient taking differently: Take 20 mg by mouth daily. 11/28/23   Kozlow, Camellia PARAS, MD  fexofenadine  (ALLEGRA ) 180 MG tablet 1-2 tablets 2 times daily Patient taking differently: Take 180 mg by mouth daily. 1-2 tablets 2 times daily 11/28/23   Kozlow, Eric J, MD  ibuprofen (ADVIL) 800 MG tablet Take 800 mg by mouth 3 (three) times daily as needed. 10/25/23   [provider]  olmesartan-hydrochlorothiazide (BENICAR HCT) 40-25 MG tablet Take 1 tablet by mouth daily. 06/04/21   [provider]  valACYclovir (VALTREX) 500 MG tablet Take 500 mg by mouth daily.    [provider]    Current Outpatient Medications  Medication Sig Dispense Refill   5-Hydroxytryptophan (5-HTP PO) Take 3 tablets by mouth daily.     famotidine  (PEPCID ) 20 MG tablet Take 1 tablet (20 mg total) by  mouth 2 (two) times daily. (Patient taking differently: Take 20 mg by mouth daily.) 60 tablet 5   fexofenadine  (ALLEGRA ) 180 MG tablet 1-2 tablets 2 times daily (Patient taking differently: Take 180 mg by mouth daily. 1-2 tablets 2 times daily) 120 tablet 5   ibuprofen (ADVIL) 800 MG tablet Take 800 mg by mouth 3 (three) times daily as needed.     olmesartan-hydrochlorothiazide (BENICAR HCT) 40-25 MG tablet Take 1 tablet by mouth daily.     valACYclovir (VALTREX) 500 MG tablet Take 500 mg by mouth daily.     Current Facility-Administered Medications  Medication Dose Route Frequency Provider Last Rate Last Admin   0.9 %  sodium chloride  infusion  500 mL Intravenous Continuous Charlanne Groom, MD        Allergies as of 02/10/2024 - Review Complete 02/10/2024  Allergen Reaction Noted   Oxycodone Shortness Of Breath 12/28/2015   Mobic [meloxicam] Itching 11/28/2023   Oxycontin [oxycodone hcl]  02/04/2019   Tramadol Rash, Dermatitis, and Other (See Comments) 12/28/2015    Family History  Problem Relation Age of Onset   Thyroid  disease Mother    Heart disease Father    Colon polyps Sister 3   Thyroid  disease Sister    Diabetes Paternal Aunt    Heart disease Paternal Aunt  Diabetes Paternal Uncle    Heart disease Paternal Grandmother    Stroke Daughter    Celiac disease Daughter    Healthy Daughter    Thyroid  disease Daughter    Healthy Son    Colon cancer Neg Hx    Esophageal cancer Neg Hx    Rectal cancer Neg Hx    Stomach cancer Neg Hx     Social History   Socioeconomic History   Marital status: Married    Spouse name: Not on file   Number of children: Not on file   Years of education: Not on file   Highest education level: Not on file  Occupational History   Not on file  Tobacco Use   Smoking status: Never    Passive exposure: Never   Smokeless tobacco: Never  Vaping Use   Vaping status: Never Used  Substance and Sexual Activity   Alcohol use: Not Currently     Comment: Socially   Drug use: Never   Sexual activity: Not on file  Other Topics Concern   Not on file  Social History Narrative   Not on file   Social Drivers of Health   Financial Resource Strain: Not on file  Food Insecurity: Not on file  Transportation Needs: Not on file  Physical Activity: Not on file  Stress: Not on file  Social Connections: Not on file  Intimate Partner Violence: Not on file    Review of Systems: Positive for none All other review of systems negative except as mentioned in the HPI.  Physical Exam: Vital signs in last 24 hours: @VSRANGES @   General:   Alert,  Well-developed, well-nourished, pleasant and cooperative in NAD Lungs:  Clear throughout to auscultation.   Heart:  Regular rate and rhythm; no murmurs, clicks, rubs,  or gallops. Abdomen:  Soft, nontender and nondistended. Normal bowel sounds.   Neuro/Psych:  Alert and cooperative. Normal mood and affect. A and O x 3    No significant changes were identified.  The patient continues to be an appropriate candidate for the planned procedure and anesthesia.   Anselm Bring, MD. [facility] 02/10/2024 9:22 AM@

## 2024-02-10 NOTE — Progress Notes (Signed)
 Called to room to assist during endoscopic procedure.  Patient ID and intended procedure confirmed with present staff. Received instructions for my participation in the procedure from the performing physician.

## 2024-02-10 NOTE — Progress Notes (Signed)
 Transferred to PACU via stretcher.  Not responding to stimulation at this time.  VSS upon leaving procedure room.

## 2024-02-10 NOTE — Patient Instructions (Signed)
-   Resume previous diet. - Continue present medications. - Await pathology results. - Repeat colonoscopy for surveillance based on  pathology results.  YOU HAD AN ENDOSCOPIC PROCEDURE TODAY AT THE [facility]:   Refer to the procedure report that was given to you for any specific questions about what was found during the examination.  If the procedure report does not answer your questions, please call your gastroenterologist to clarify.  If you requested that your care partner not be given the details of your procedure findings, then the procedure report has been included in a sealed envelope for you to review at your convenience later.  YOU SHOULD EXPECT: Some feelings of bloating in the abdomen. Passage of more gas than usual.  Walking can help get rid of the air that was put into your GI tract during the procedure and reduce the bloating. If you had a lower endoscopy (such as a colonoscopy or flexible sigmoidoscopy) you may notice spotting of blood in your stool or on the toilet paper. If you underwent a bowel prep for your procedure, you may not have a normal bowel movement for a few days.  Please Note:  You might notice some irritation and congestion in your nose or some drainage.  This is from the oxygen used during your procedure.  There is no need for concern and it should clear up in a day or so.  SYMPTOMS TO REPORT IMMEDIATELY:  Following lower endoscopy (colonoscopy or flexible sigmoidoscopy):  Excessive amounts of blood in the stool  Significant tenderness or worsening of abdominal pains  Swelling of the abdomen that is new, acute  Fever of 100F or higher   For urgent or emergent issues, a gastroenterologist can be reached at any hour by calling [phone]. Do not use MyChart messaging for urgent concerns.    DIET:  We do recommend a small meal at first, but then you may proceed to your regular diet.  Drink plenty of fluids but you should avoid alcoholic  beverages for 24 hours.  ACTIVITY:  You should plan to take it easy for the rest of today and you should NOT DRIVE or use heavy machinery until tomorrow (because of the sedation medicines used during the test).    FOLLOW UP: Our staff will call the number listed on your records the next business day following your procedure.  We will call around 7:15- 8:00 am to check on you and address any questions or concerns that you may have regarding the information given to you following your procedure. If we do not reach you, we will leave a message.     If any biopsies were taken you will be contacted by phone or by letter within the next 1-3 weeks.  Please call us at [phone] if you have not heard about the biopsies in 3 weeks.    SIGNATURES/CONFIDENTIALITY: You and/or your care partner have signed paperwork which will be entered into your electronic medical record.  These signatures attest to the fact that that the information above on your After Visit Summary has been reviewed and is understood.  Full responsibility of the confidentiality of this discharge information lies with you and/or your care-partner. 

## 2024-02-10 NOTE — Op Note (Signed)
 [facility] Patient Name: Connie Cummings Procedure Date: 02/10/2024 9:15 AM MRN: [id number] Endoscopist: Lynnie Bring , MD, [id number] Age: 63 Referring MD:  Date of Birth: 1960-04-24 Gender: Female Account #: [id number] Procedure:                Colonoscopy Indications:              Colon cancer screening in patient at increased                            risk: FH polyps (mom amd dad around 55, sister at                            age 73) Medicines:                Monitored Anesthesia Care Procedure:                Pre-Anesthesia Assessment:                           - Prior to the procedure, a History and Physical                            was performed, and patient medications and                            allergies were reviewed. The patient's tolerance of                            previous anesthesia was also reviewed. The risks                            and benefits of the procedure and the sedation                            options and risks were discussed with the patient.                            All questions were answered, and informed consent                            was obtained. Prior Anticoagulants: The patient has                            taken no anticoagulant or antiplatelet agents. ASA                            Grade Assessment: II - A patient with mild systemic                            disease. After reviewing the risks and benefits,                            the patient was deemed in satisfactory condition to  undergo the procedure.                           After obtaining informed consent, the colonoscope                            was passed under direct vision. Throughout the                            procedure, the patient's blood pressure, pulse, and                            oxygen saturations were monitored continuously. The                            CF [id number] #[id number] was introduced through the anus                             and advanced to the the cecum, identified by                            appendiceal orifice and ileocecal valve. The                            colonoscopy was performed without difficulty. The                            patient tolerated the procedure well. The quality                            of the bowel preparation was good. The ileocecal                            valve, appendiceal orifice, and rectum were                            photographed. Scope In: 9:28:43 AM Scope Out: 9:52:53 AM Scope Withdrawal Time: 0 hours 14 minutes 21 seconds  Total Procedure Duration: 0 hours 24 minutes 10 seconds  Findings:                 Three sessile polyps were found in the proximal                            ascending colon, mid ascending colon and cecum. The                            polyps were 4 to 5 mm in size. These polyps were                            removed with a cold snare. Resection and retrieval                            were complete.  Two sessile polyps were found in the proximal                            descending colon and mid descending colon. The                            polyps were 4 to 5 mm in size. These polyps were                            removed with a cold snare. Resection and retrieval                            were complete.                           A few small-mouthed diverticula were found in the                            sigmoid colon.                           Non-bleeding internal hemorrhoids were found during                            retroflexion. The hemorrhoids were small and Grade                            I (internal hemorrhoids that do not prolapse).                           Retroflexion in the right colon was performed.                           The exam was otherwise without abnormality on                            direct and retroflexion views. Complications:            No immediate  complications. Estimated Blood Loss:     Estimated blood loss: none. Impression:               - Three 4 to 5 mm polyps in the proximal ascending                            colon, in the mid ascending colon and in the cecum,                            removed with a cold snare. Resected and retrieved.                           - Two 4 to 5 mm polyps in the proximal descending                            colon and in the mid descending colon,  removed with                            a cold snare. Resected and retrieved.                           - Mild sigmoid diverticulosis.                           - Non-bleeding internal hemorrhoids.                           - The examination was otherwise normal on direct                            and retroflexion views. Recommendation:           - Patient has a contact number available for                            emergencies. The signs and symptoms of potential                            delayed complications were discussed with the                            patient. Return to normal activities tomorrow.                            Written discharge instructions were provided to the                            patient.                           - Resume previous diet.                           - Continue present medications.                           - Await pathology results.                           - Repeat colonoscopy for surveillance based on                            pathology results.                           - The findings and recommendations were discussed                            with the patient's family. Lynnie Bring, MD 02/10/2024 9:59:04 AM This report has been signed electronically.

## 2024-02-11 ENCOUNTER — Telehealth: Payer: Self-pay

## 2024-02-11 NOTE — Telephone Encounter (Signed)
  Follow up Call-     02/10/2024    8:59 AM  Call back number  Post procedure Call Back phone  # [phone]  Permission to leave phone message Yes     Patient questions:  Do you have a fever, pain , or abdominal swelling? No. Pain Score  0 *  Have you tolerated food without any problems? Yes.    Have you been able to return to your normal activities? Yes.    Do you have any questions about your discharge instructions: Diet   No. Medications  No. Follow up visit  No.  Do you have questions or concerns about your Care? No.  Actions: * If pain score is 4 or above: No action needed, pain <4.

## 2024-02-12 LAB — SURGICAL PATHOLOGY

## 2024-02-15 ENCOUNTER — Ambulatory Visit: Payer: Self-pay | Admitting: Gastroenterology

## 2024-02-25 DIAGNOSIS — Z85828 Personal history of other malignant neoplasm of skin: Secondary | ICD-10-CM | POA: Diagnosis not present

## 2024-02-25 DIAGNOSIS — L821 Other seborrheic keratosis: Secondary | ICD-10-CM | POA: Diagnosis not present

## 2024-02-25 DIAGNOSIS — D2361 Other benign neoplasm of skin of right upper limb, including shoulder: Secondary | ICD-10-CM | POA: Diagnosis not present

## 2024-02-25 DIAGNOSIS — D1801 Hemangioma of skin and subcutaneous tissue: Secondary | ICD-10-CM | POA: Diagnosis not present

## 2024-02-25 DIAGNOSIS — L570 Actinic keratosis: Secondary | ICD-10-CM | POA: Diagnosis not present

## 2024-02-25 DIAGNOSIS — L814 Other melanin hyperpigmentation: Secondary | ICD-10-CM | POA: Diagnosis not present

## 2024-03-23 ENCOUNTER — Encounter: Payer: Self-pay | Admitting: Allergy and Immunology

## 2024-03-23 ENCOUNTER — Ambulatory Visit: Admitting: Allergy and Immunology

## 2024-03-23 VITALS — BP 118/76 | HR 80 | Resp 16

## 2024-03-23 DIAGNOSIS — L299 Pruritus, unspecified: Secondary | ICD-10-CM | POA: Diagnosis not present

## 2024-03-23 DIAGNOSIS — L719 Rosacea, unspecified: Secondary | ICD-10-CM | POA: Diagnosis not present

## 2024-03-23 DIAGNOSIS — R7989 Other specified abnormal findings of blood chemistry: Secondary | ICD-10-CM

## 2024-03-23 NOTE — Patient Instructions (Addendum)
" °  1. Return for skin testing (no antihistamines x 3 days)  2. Raise itch threshold with the following:   A. Fexofenadine  180 - 1-2 tablets 2 times per day (MAX=4/day)  B. Famotidine  20 mg - 1 tablet 2 times per day  3. Influenza = Tamiflu. Covid = Paxlovid "

## 2024-03-23 NOTE — Progress Notes (Unsigned)
 "  [place] - [place] - [place] - [place] - [place]   Follow-up Note  Referring Provider: Fernand Tracey LABOR, MD Primary Provider: Fernand Tracey LABOR, MD Date of Office Visit: 03/23/2024  Subjective:   Connie Cummings (DOB: 06/09/1960) is a 63 y.o. female who [facility] in re-evaluation of the following:  HPI: Connie Cummings returns to this clinic in evaluation of pruritic disorder, history of rosacea, history of elevated liver function test, history of elevated ANA.  I last saw her in this clinic 30 December 2023.  As long as she remains on 1 tablet of fexofenadine  and 1 tablet of famotidine  she does quite well with her pruritus and does not really have any significant issues with this condition.  She is very interested in looking for the cause of her pruritus and would like to get skin tested for both food and aeroallergen allergies.  Her rosacea is not bothering her at all and she does not use any topical agents at this point.  Allergies as of 03/23/2024       Reactions   Oxycodone Shortness Of Breath   Mobic [meloxicam] Itching   Oxycontin [oxycodone Hcl]    Tramadol Rash, Dermatitis, Other (See Comments)        Medication List    5-HTP PO Take 3 tablets by mouth daily.   famotidine  20 MG tablet Commonly known as: PEPCID  Take 1 tablet (20 mg total) by mouth 2 (two) times daily.   fexofenadine  180 MG tablet Commonly known as: ALLEGRA  1-2 tablets 2 times daily   ibuprofen 800 MG tablet Commonly known as: ADVIL Take 800 mg by mouth 3 (three) times daily as needed.   olmesartan-hydrochlorothiazide 40-25 MG tablet Commonly known as: BENICAR HCT Take 1 tablet by mouth daily.   valACYclovir 500 MG tablet Commonly known as: VALTREX Take 500 mg by mouth daily.    Past Medical History:  Diagnosis Date   Cancer (HCC)    melanoma   Fatty liver    Hypertension    Kyphosis    Scoliosis    Thyroid  nodule    Per patient     Past Surgical History:  Procedure Laterality Date   APPENDECTOMY     BACK SURGERY  2016   scoliosis and kyphosis so rods and screws in back   CESAREAN SECTION     COLONOSCOPY  01/19/2013   Small internal hemorrhoids. Otherwise normal colonoscopy to terminal ileum    COLONOSCOPY  02/2019   RG-MAC-Clenpiq (good)-normal-5 yr recall   TONSILLECTOMY      Review of systems negative except as noted in HPI / PMHx or noted below:  Review of Systems  Constitutional: Negative.   HENT: Negative.    Eyes: Negative.   Respiratory: Negative.    Cardiovascular: Negative.   Gastrointestinal: Negative.   Genitourinary: Negative.   Musculoskeletal: Negative.   Skin: Negative.   Neurological: Negative.   Endo/Heme/Allergies: Negative.   Psychiatric/Behavioral: Negative.       Objective:   Vitals:   03/23/24 1705  BP: 118/76  Pulse: 80  Resp: 16  SpO2: 97%          Physical Exam Constitutional:      Appearance: She is not diaphoretic.  HENT:     Head: Normocephalic.     Right Ear: Tympanic membrane, ear canal and external ear normal.     Left Ear: Tympanic membrane, ear canal and external ear normal.     Nose:  Nose normal. No mucosal edema or rhinorrhea.     Mouth/Throat:     Pharynx: Uvula midline. No oropharyngeal exudate.  Eyes:     Conjunctiva/sclera: Conjunctivae normal.  Neck:     Thyroid : No thyromegaly.     Trachea: Trachea normal. No tracheal tenderness or tracheal deviation.  Cardiovascular:     Rate and Rhythm: Normal rate and regular rhythm.     Heart sounds: Normal heart sounds, S1 normal and S2 normal. No murmur heard. Pulmonary:     Effort: No respiratory distress.     Breath sounds: Normal breath sounds. No stridor. No wheezing or rales.  Lymphadenopathy:     Head:     Right side of head: No tonsillar adenopathy.     Left side of head: No tonsillar adenopathy.     Cervical: No cervical adenopathy.  Skin:    Findings: Rash (Facial erythema and  telangiectasia) present. No erythema.     Nails: There is no clubbing.  Neurological:     Mental Status: She is alert.     Diagnostics:     Results of blood tests obtained 31 December 2023 identified ferritin 84 ng/DL, LKM antibody 0.9 U, smooth muscle antibody 9U, negative viral hepatitis screen  Results of a complete abdominal ultrasound obtained 06 January 2024 identified the following:  1. Hepatic steatosis. 2. Possible hypoechoic mass in the interpolar left kidney versus prominent lobulation. Recommend further evaluation with dedicated renal protocol CT or MRI with and without contrast.  Results of abdominal MRI obtained 13 January 2024 identified mild fatty liver, small subcentimeter simple bilateral renal cysts, no renal mass.  Assessment and Plan:   1. Pruritic disorder   2. Elevated liver function tests   3. Rosacea    1. Return for skin testing (no antihistamines x 3 days)  2. Raise itch threshold with the following:   A. Fexofenadine  180 - 1-2 tablets 2 times per day (MAX=4/day)  B. Famotidine  20 mg - 1 tablet 2 times per day  3. Influenza = Tamiflu. Covid = Paxlovid  Connie Cummings will return to this clinic for skin testing directed against aeroallergens and foods as a general screen for her pruritic disorder.  She will remain on an H1 and H2 receptor blocker on a consistent basis other than to have skin testing performed.  Her elevated liver function tests are associated with fatty liver and she understands what is at stake regarding this issue and how to address this issue.  Camellia Denis, MD Allergy / Immunology [facility] "

## 2024-03-24 ENCOUNTER — Encounter: Payer: Self-pay | Admitting: Allergy and Immunology

## 2024-04-06 ENCOUNTER — Ambulatory Visit: Admitting: Allergy and Immunology

## 2024-04-06 DIAGNOSIS — L299 Pruritus, unspecified: Secondary | ICD-10-CM | POA: Diagnosis not present

## 2024-04-07 NOTE — Progress Notes (Signed)
 Connie Cummings returns to this clinic to have skin testing performed.  Allergy skin testing identified hypersensitivity grass pollen.  She did not demonstrate any hypersensitivity against foods.

## 2024-04-13 ENCOUNTER — Telehealth: Payer: Self-pay | Admitting: Allergy and Immunology

## 2024-04-13 MED ORDER — FAMOTIDINE 20 MG PO TABS: 20.0000 mg | ORAL_TABLET | Freq: Two times a day (BID) | ORAL | 3 refills | Status: AC

## 2024-04-13 NOTE — Telephone Encounter (Signed)
 Refill sent to requested pharmacy.

## 2024-04-13 NOTE — Telephone Encounter (Signed)
 Patient is requesting a 90 day supply of Famotidine  sent to [facility].

## 2024-06-08 ENCOUNTER — Encounter: Admitting: Family

## 2024-06-10 ENCOUNTER — Encounter: Admitting: Allergy and Immunology

## 2024-06-12 ENCOUNTER — Encounter: Admitting: Internal Medicine
